# Patient Record
Sex: Male | Born: 1955 | Race: Black or African American | Hispanic: No | Marital: Single | Smoking: Never smoker
Health system: Southern US, Community
[De-identification: ages and names within clinical notes are randomized; demographics above are authoritative.]

## PROBLEM LIST (undated history)

## (undated) DIAGNOSIS — I2510 Atherosclerotic heart disease of native coronary artery without angina pectoris: Secondary | ICD-10-CM

## (undated) DIAGNOSIS — I255 Ischemic cardiomyopathy: Secondary | ICD-10-CM

## (undated) DIAGNOSIS — E785 Hyperlipidemia, unspecified: Secondary | ICD-10-CM

## (undated) DIAGNOSIS — I10 Essential (primary) hypertension: Secondary | ICD-10-CM

## (undated) HISTORY — DX: Atherosclerotic heart disease of native coronary artery without angina pectoris: I25.10

## (undated) HISTORY — DX: Essential (primary) hypertension: I10

## (undated) HISTORY — DX: Hyperlipidemia, unspecified: E78.5

## (undated) HISTORY — DX: Ischemic cardiomyopathy: I25.5

---

## 2013-05-29 ENCOUNTER — Other Ambulatory Visit (HOSPITAL_COMMUNITY): Payer: Self-pay | Admitting: Family Medicine

## 2013-05-29 DIAGNOSIS — R0602 Shortness of breath: Secondary | ICD-10-CM

## 2013-06-09 ENCOUNTER — Ambulatory Visit (HOSPITAL_COMMUNITY)
Admission: RE | Admit: 2013-06-09 | Discharge: 2013-06-09 | Disposition: A | Discharge status: Home / Self Care | Payer: Self-pay | Source: Ambulatory Visit | Attending: Family Medicine | Admitting: Family Medicine

## 2013-06-09 DIAGNOSIS — R0602 Shortness of breath: Secondary | ICD-10-CM | POA: Insufficient documentation

## 2013-06-09 MED ORDER — ALBUTEROL SULFATE (5 MG/ML) 0.5% IN NEBU
2.5000 mg | INHALATION_SOLUTION | Freq: Once | RESPIRATORY_TRACT | Status: AC
  Administered 2013-06-09: 2.5 mg via RESPIRATORY_TRACT

## 2017-03-29 ENCOUNTER — Encounter: Payer: Self-pay | Admitting: Emergency Medicine

## 2017-03-29 ENCOUNTER — Emergency Department
Admission: EM | Admit: 2017-03-29 | Discharge: 2017-03-29 | Disposition: A | Discharge status: Home / Self Care | Payer: Self-pay | Attending: Emergency Medicine | Admitting: Emergency Medicine

## 2017-03-29 DIAGNOSIS — T63441A Toxic effect of venom of bees, accidental (unintentional), initial encounter: Secondary | ICD-10-CM | POA: Insufficient documentation

## 2017-03-29 DIAGNOSIS — Y939 Activity, unspecified: Secondary | ICD-10-CM | POA: Insufficient documentation

## 2017-03-29 DIAGNOSIS — Y999 Unspecified external cause status: Secondary | ICD-10-CM | POA: Insufficient documentation

## 2017-03-29 DIAGNOSIS — Y929 Unspecified place or not applicable: Secondary | ICD-10-CM | POA: Insufficient documentation

## 2017-03-29 MED ORDER — HYDROXYZINE HCL 50 MG PO TABS
50.0000 mg | ORAL_TABLET | Freq: Once | ORAL | Status: AC
  Administered 2017-03-29: 50 mg via ORAL
  Filled 2017-03-29: qty 1

## 2017-03-29 MED ORDER — HYDROXYZINE HCL 50 MG PO TABS: 50.0000 mg | ORAL_TABLET | Freq: Three times a day (TID) | ORAL | 0 refills | Status: DC | PRN

## 2017-03-29 MED ORDER — PREDNISONE 20 MG PO TABS
60.0000 mg | ORAL_TABLET | Freq: Once | ORAL | Status: AC
  Administered 2017-03-29: 60 mg via ORAL
  Filled 2017-03-29: qty 3

## 2017-03-29 MED ORDER — METHYLPREDNISOLONE 4 MG PO TBPK: ORAL_TABLET | ORAL | 0 refills | Status: DC

## 2017-03-29 NOTE — ED Provider Notes (Signed)
[facility] Emergency Department Provider Note   ____________________________________________   First MD Initiated Contact with Patient 03/29/17 0920     (approximate)  I have reviewed the triage vital signs and the nursing notes.   HISTORY  Chief Complaint Bee stings    HPI Todd Reynolds is a 61 y.o. male patient complaining of bilateral hand edema secondary to bee stings yesterday. Patient denies any anaphylactic signs and symptoms. Patient state is not allergic to bee stings.Patient rates his discomfort/pain as a 5/10. No palliative measures for complaint.   History reviewed. No pertinent past medical history.  There are no active problems to display for this patient.   No past surgical history on file.  Prior to Admission medications   Medication Sig Start Date End Date Taking? Authorizing Provider  hydrOXYzine (ATARAX/VISTARIL) 50 MG tablet Take 1 tablet (50 mg total) by mouth 3 (three) times daily as needed for itching. 03/29/17   Joni Reining, PA-C  methylPREDNISolone (MEDROL DOSEPAK) 4 MG TBPK tablet Take Tapered dose as directed start first dose 03-30-2017. 03/29/17   Joni Reining, PA-C    Allergies Patient has no known allergies.  No family history on file.  Social History Social History  Substance Use Topics  . Smoking status: Not on file  . Smokeless tobacco: Not on file  . Alcohol use Not on file    Review of Systems  Constitutional: No fever/chills Eyes: No visual changes. ENT: No sore throat. Cardiovascular: Denies chest pain. Respiratory: Denies shortness of breath. Gastrointestinal: No abdominal pain.  No nausea, no vomiting.  No diarrhea.  No constipation. Genitourinary: Negative for dysuria. Musculoskeletal: Negative for back pain. Skin: Negative for rash.Bilateral hand edema Neurological: Negative for headaches, focal weakness or numbness.   ____________________________________________   PHYSICAL  EXAM:  VITAL SIGNS: ED Triage Vitals [03/29/17 0905]  Enc Vitals Group     BP 133/90     Pulse Rate 71     Resp 18     Temp 98 F (36.7 C)     Temp Source Oral     SpO2 97 %     Weight 180 lb (81.6 kg)     Height 5\' 10"  (1.778 m)     Head Circumference      Peak Flow      Pain Score 5     Pain Loc      Pain Edu?      Excl. in GC?     Constitutional: Alert and oriented. Well appearing and in no acute distress. Eyes: Conjunctivae are normal. PERRL. EOMI. Head: Atraumatic. Nose: No congestion/rhinnorhea. Mouth/Throat: Mucous membranes are moist.  Oropharynx non-erythematous. Neck: No stridor.   Cardiovascular: Normal rate, regular rhythm. Grossly normal heart sounds.  Good peripheral circulation. Respiratory: Normal respiratory effort.  No retractions. Lungs CTAB. Neurologic:  Normal speech and language. No gross focal neurologic deficits are appreciated. No gait instability. Skin:  Skin is warm, dry and intact. No rash noted.Bilateral hand edema Psychiatric: Mood and affect are normal. Speech and behavior are normal.  ____________________________________________   LABS (all labs ordered are listed, but only abnormal results are displayed)  Labs Reviewed - No data to display ____________________________________________  EKG   ____________________________________________  RADIOLOGY  No results found.  ____________________________________________   PROCEDURES  Procedure(s) performed: None  Procedures  Critical Care performed: No  ____________________________________________   INITIAL IMPRESSION / ASSESSMENT AND PLAN / ED COURSE  Pertinent labs & imaging results that were available during  my care of the patient were reviewed by me and considered in my medical decision making (see chart for details).  Localized reaction to bee sting. No anaphylactic. Patient given discharge Instructions. Patient advised [facility]  persists.      ____________________________________________   FINAL CLINICAL IMPRESSION(S) / ED DIAGNOSES  Final diagnoses:  Bee sting, accidental or unintentional, initial encounter      NEW MEDICATIONS STARTED DURING THIS VISIT:  New Prescriptions   HYDROXYZINE (ATARAX/VISTARIL) 50 MG TABLET    Take 1 tablet (50 mg total) by mouth 3 (three) times daily as needed for itching.   METHYLPREDNISOLONE (MEDROL DOSEPAK) 4 MG TBPK TABLET    Take Tapered dose as directed start first dose 03-30-2017.     Note:  This document was prepared using Dragon voice recognition software and may include unintentional dictation errors.    Joni ReiningSmith, Damario Gillie K, PA-C 03/29/17 0940    Minna AntisPaduchowski, Kevin, MD 03/29/17 [phone]

## 2017-03-29 NOTE — ED Notes (Signed)
See triage note  States he was doing some work yesterday and had several bee stings to hands  Both hands swollen   No resp distress

## 2017-03-29 NOTE — ED Triage Notes (Signed)
Pt reports had multiple bee stings to hands yesterday. Pt denies any difficulty breathing. Respirations even and nonlabored in triage, speaking in complete sentences without difficulty. Pt presents with swelling to hands bilaterally.

## 2018-08-28 ENCOUNTER — Other Ambulatory Visit: Payer: Self-pay

## 2018-08-28 ENCOUNTER — Inpatient Hospital Stay
Admission: EM | Admit: 2018-08-28 | Discharge: 2018-08-30 | DRG: 247 | Disposition: A | Discharge status: Home / Self Care | Payer: Medicaid Other | Attending: Internal Medicine | Admitting: Internal Medicine

## 2018-08-28 ENCOUNTER — Emergency Department: Payer: Medicaid Other

## 2018-08-28 ENCOUNTER — Encounter
Admission: EM | Disposition: A | Discharge status: Home / Self Care | Payer: Self-pay | Source: Home / Self Care | Attending: Internal Medicine

## 2018-08-28 DIAGNOSIS — I214 Non-ST elevation (NSTEMI) myocardial infarction: Principal | ICD-10-CM | POA: Diagnosis present

## 2018-08-28 DIAGNOSIS — E876 Hypokalemia: Secondary | ICD-10-CM | POA: Diagnosis present

## 2018-08-28 DIAGNOSIS — Z8249 Family history of ischemic heart disease and other diseases of the circulatory system: Secondary | ICD-10-CM | POA: Diagnosis not present

## 2018-08-28 DIAGNOSIS — I2510 Atherosclerotic heart disease of native coronary artery without angina pectoris: Secondary | ICD-10-CM | POA: Diagnosis present

## 2018-08-28 DIAGNOSIS — Z833 Family history of diabetes mellitus: Secondary | ICD-10-CM

## 2018-08-28 DIAGNOSIS — Z23 Encounter for immunization: Secondary | ICD-10-CM | POA: Diagnosis not present

## 2018-08-28 DIAGNOSIS — E785 Hyperlipidemia, unspecified: Secondary | ICD-10-CM | POA: Diagnosis present

## 2018-08-28 DIAGNOSIS — R451 Restlessness and agitation: Secondary | ICD-10-CM | POA: Diagnosis not present

## 2018-08-28 DIAGNOSIS — I10 Essential (primary) hypertension: Secondary | ICD-10-CM | POA: Diagnosis present

## 2018-08-28 DIAGNOSIS — R079 Chest pain, unspecified: Secondary | ICD-10-CM | POA: Diagnosis present

## 2018-08-28 DIAGNOSIS — Z79899 Other long term (current) drug therapy: Secondary | ICD-10-CM

## 2018-08-28 DIAGNOSIS — Z7902 Long term (current) use of antithrombotics/antiplatelets: Secondary | ICD-10-CM | POA: Diagnosis not present

## 2018-08-28 DIAGNOSIS — F1523 Other stimulant dependence with withdrawal: Secondary | ICD-10-CM | POA: Diagnosis present

## 2018-08-28 DIAGNOSIS — Z794 Long term (current) use of insulin: Secondary | ICD-10-CM

## 2018-08-28 DIAGNOSIS — Z7982 Long term (current) use of aspirin: Secondary | ICD-10-CM | POA: Diagnosis not present

## 2018-08-28 HISTORY — PX: CORONARY THROMBECTOMY: CATH118304

## 2018-08-28 HISTORY — PX: LEFT HEART CATH AND CORONARY ANGIOGRAPHY: CATH118249

## 2018-08-28 HISTORY — PX: CORONARY STENT INTERVENTION: CATH118234

## 2018-08-28 LAB — URINE DRUG SCREEN, QUALITATIVE (ARMC ONLY)
AMPHETAMINES, UR SCREEN: POSITIVE — AB
Barbiturates, Ur Screen: NOT DETECTED
Benzodiazepine, Ur Scrn: POSITIVE — AB
Cannabinoid 50 Ng, Ur ~~LOC~~: NOT DETECTED
Cocaine Metabolite,Ur ~~LOC~~: NOT DETECTED
MDMA (Ecstasy)Ur Screen: NOT DETECTED
Methadone Scn, Ur: NOT DETECTED
Opiate, Ur Screen: NOT DETECTED
Phencyclidine (PCP) Ur S: NOT DETECTED
Tricyclic, Ur Screen: NOT DETECTED

## 2018-08-28 LAB — CBC
HCT: 47.6 % (ref 39.0–52.0)
HEMOGLOBIN: 15 g/dL (ref 13.0–17.0)
MCH: 27.8 pg (ref 26.0–34.0)
MCHC: 31.5 g/dL (ref 30.0–36.0)
MCV: 88.1 fL (ref 80.0–100.0)
Platelets: 213 10*3/uL (ref 150–400)
RBC: 5.4 MIL/uL (ref 4.22–5.81)
RDW: 13.7 % (ref 11.5–15.5)
WBC: 7.1 10*3/uL (ref 4.0–10.5)
nRBC: 0 % (ref 0.0–0.2)

## 2018-08-28 LAB — BASIC METABOLIC PANEL
Anion gap: 8 (ref 5–15)
BUN: 7 mg/dL (ref 6–20)
CO2: 27 mmol/L (ref 22–32)
CREATININE: 0.98 mg/dL (ref 0.61–1.24)
Calcium: 9 mg/dL (ref 8.9–10.3)
Chloride: 105 mmol/L (ref 98–111)
GFR calc non Af Amer: >60 mL/min (ref >60)
GLUCOSE: 119 mg/dL — AB (ref 70–99)
POTASSIUM: 3.2 mmol/L — AB (ref 3.5–5.1)
SODIUM: 140 mmol/L (ref 135–145)

## 2018-08-28 LAB — TROPONIN I
TROPONIN I: 6.41 ng/mL — AB (ref <0.03)
Troponin I: 5.24 ng/mL (ref <0.03)

## 2018-08-28 LAB — APTT: aPTT: 29 seconds (ref 24–36)

## 2018-08-28 LAB — POCT ACTIVATED CLOTTING TIME
Activated Clotting Time: 268 seconds
Activated Clotting Time: 279 seconds

## 2018-08-28 LAB — ETHANOL: Alcohol, Ethyl (B): 12 mg/dL — ABNORMAL HIGH (ref <10)

## 2018-08-28 LAB — MAGNESIUM: MAGNESIUM: 1.9 mg/dL (ref 1.7–2.4)

## 2018-08-28 LAB — MRSA PCR SCREENING: MRSA by PCR: NEGATIVE

## 2018-08-28 LAB — PROTIME-INR
INR: 0.99
Prothrombin Time: 13 seconds (ref 11.4–15.2)

## 2018-08-28 SURGERY — LEFT HEART CATH AND CORONARY ANGIOGRAPHY
Anesthesia: Moderate Sedation

## 2018-08-28 MED ORDER — MIDAZOLAM HCL 2 MG/2ML IJ SOLN
INTRAMUSCULAR | Status: AC
  Filled 2018-08-28: qty 2

## 2018-08-28 MED ORDER — HEPARIN (PORCINE) 25000 UT/250ML-% IV SOLN
1000.0000 [IU]/h | INTRAVENOUS | Status: DC
  Administered 2018-08-28: 1000 [IU]/h via INTRAVENOUS
  Filled 2018-08-28: qty 250

## 2018-08-28 MED ORDER — TIROFIBAN HCL IN NACL 5-0.9 MG/100ML-% IV SOLN
0.1500 ug/kg/min | INTRAVENOUS | Status: DC
  Filled 2018-08-28: qty 100

## 2018-08-28 MED ORDER — ASPIRIN EC 81 MG PO TBEC
81.0000 mg | DELAYED_RELEASE_TABLET | Freq: Every day | ORAL | Status: DC
  Administered 2018-08-29 – 2018-08-30 (×2): 81 mg via ORAL
  Filled 2018-08-28 (×2): qty 1

## 2018-08-28 MED ORDER — HEPARIN BOLUS VIA INFUSION
4000.0000 [IU] | Freq: Once | INTRAVENOUS | Status: AC
  Administered 2018-08-28: 4000 [IU] via INTRAVENOUS
  Filled 2018-08-28: qty 4000

## 2018-08-28 MED ORDER — DEXMEDETOMIDINE HCL IN NACL 400 MCG/100ML IV SOLN
0.4000 ug/kg/h | INTRAVENOUS | Status: DC
  Administered 2018-08-28: 0.8 ug/kg/h via INTRAVENOUS
  Administered 2018-08-29: 0.6 ug/kg/h via INTRAVENOUS
  Filled 2018-08-28 (×2): qty 100

## 2018-08-28 MED ORDER — HEPARIN SODIUM (PORCINE) 1000 UNIT/ML IJ SOLN
INTRAMUSCULAR | Status: DC | PRN
  Administered 2018-08-28: 4000 [IU] via INTRAVENOUS
  Administered 2018-08-28: 2000 [IU] via INTRAVENOUS
  Administered 2018-08-28: 4000 [IU] via INTRAVENOUS

## 2018-08-28 MED ORDER — NITROGLYCERIN 1 MG/10 ML FOR IR/CATH LAB
INTRA_ARTERIAL | Status: DC | PRN
  Administered 2018-08-28: 200 ug via INTRACORONARY

## 2018-08-28 MED ORDER — ALPRAZOLAM 0.5 MG PO TABS
ORAL_TABLET | ORAL | Status: AC
  Administered 2018-08-28: 20:00:00
  Filled 2018-08-28: qty 1

## 2018-08-28 MED ORDER — LABETALOL HCL 5 MG/ML IV SOLN
10.0000 mg | INTRAVENOUS | Status: DC | PRN
  Filled 2018-08-28: qty 4

## 2018-08-28 MED ORDER — SODIUM CHLORIDE 0.9 % WEIGHT BASED INFUSION
3.0000 mL/kg/h | INTRAVENOUS | Status: DC
  Administered 2018-08-28: 3 mL/kg/h via INTRAVENOUS

## 2018-08-28 MED ORDER — HYDRALAZINE HCL 20 MG/ML IJ SOLN
INTRAMUSCULAR | Status: AC
  Filled 2018-08-28: qty 1

## 2018-08-28 MED ORDER — ATORVASTATIN CALCIUM 20 MG PO TABS
40.0000 mg | ORAL_TABLET | Freq: Every day | ORAL | Status: DC
  Administered 2018-08-29: 40 mg via ORAL
  Filled 2018-08-28: qty 2

## 2018-08-28 MED ORDER — TICAGRELOR 90 MG PO TABS
90.0000 mg | ORAL_TABLET | Freq: Two times a day (BID) | ORAL | Status: DC
  Administered 2018-08-29 – 2018-08-30 (×3): 90 mg via ORAL
  Filled 2018-08-28 (×4): qty 1

## 2018-08-28 MED ORDER — ENOXAPARIN SODIUM 40 MG/0.4ML ~~LOC~~ SOLN
40.0000 mg | SUBCUTANEOUS | Status: DC
  Administered 2018-08-29: 40 mg via SUBCUTANEOUS
  Filled 2018-08-28: qty 0.4

## 2018-08-28 MED ORDER — FENTANYL CITRATE (PF) 100 MCG/2ML IJ SOLN
INTRAMUSCULAR | Status: AC
  Filled 2018-08-28: qty 2

## 2018-08-28 MED ORDER — SODIUM CHLORIDE 0.9% FLUSH
3.0000 mL | Freq: Two times a day (BID) | INTRAVENOUS | Status: DC
  Administered 2018-08-28 – 2018-08-29 (×3): 3 mL via INTRAVENOUS

## 2018-08-28 MED ORDER — MIDAZOLAM HCL 2 MG/2ML IJ SOLN
INTRAMUSCULAR | Status: DC | PRN
  Administered 2018-08-28 (×2): 1 mg via INTRAVENOUS

## 2018-08-28 MED ORDER — SODIUM CHLORIDE 0.9% FLUSH: 3.0000 mL | INTRAVENOUS | Status: DC | PRN

## 2018-08-28 MED ORDER — POTASSIUM CHLORIDE CRYS ER 20 MEQ PO TBCR: 40.0000 meq | EXTENDED_RELEASE_TABLET | Freq: Once | ORAL | Status: DC

## 2018-08-28 MED ORDER — POTASSIUM CHLORIDE 10 MEQ/100ML IV SOLN
10.0000 meq | INTRAVENOUS | Status: DC
  Administered 2018-08-28 – 2018-08-29 (×2): 10 meq via INTRAVENOUS
  Filled 2018-08-28 (×4): qty 100

## 2018-08-28 MED ORDER — METOPROLOL TARTRATE 25 MG PO TABS
12.5000 mg | ORAL_TABLET | Freq: Two times a day (BID) | ORAL | Status: DC
  Administered 2018-08-29 – 2018-08-30 (×3): 12.5 mg via ORAL
  Filled 2018-08-28 (×3): qty 1

## 2018-08-28 MED ORDER — SODIUM CHLORIDE 0.9 % WEIGHT BASED INFUSION: 1.0000 mL/kg/h | INTRAVENOUS | Status: DC

## 2018-08-28 MED ORDER — SODIUM CHLORIDE 0.9 % IV SOLN: 250.0000 mL | INTRAVENOUS | Status: DC | PRN

## 2018-08-28 MED ORDER — HEPARIN SODIUM (PORCINE) 1000 UNIT/ML IJ SOLN
INTRAMUSCULAR | Status: AC
  Filled 2018-08-28: qty 1

## 2018-08-28 MED ORDER — LORAZEPAM 2 MG/ML IJ SOLN
0.5000 mg | INTRAMUSCULAR | Status: DC | PRN
  Administered 2018-08-28: 0.5 mg via INTRAVENOUS
  Filled 2018-08-28: qty 1

## 2018-08-28 MED ORDER — TIROFIBAN (AGGRASTAT) BOLUS VIA INFUSION
INTRAVENOUS | Status: DC | PRN
  Administered 2018-08-28: 2040 ug via INTRAVENOUS

## 2018-08-28 MED ORDER — NITROGLYCERIN 0.4 MG SL SUBL
0.4000 mg | SUBLINGUAL_TABLET | SUBLINGUAL | Status: DC | PRN
  Administered 2018-08-28: 0.4 mg via SUBLINGUAL
  Filled 2018-08-28: qty 1

## 2018-08-28 MED ORDER — ZOLPIDEM TARTRATE 5 MG PO TABS: 5.0000 mg | ORAL_TABLET | Freq: Every evening | ORAL | Status: DC | PRN

## 2018-08-28 MED ORDER — MIDAZOLAM HCL 2 MG/2ML IJ SOLN
1.0000 mg | Freq: Once | INTRAMUSCULAR | Status: DC
  Administered 2018-08-28: 1 mg via INTRAVENOUS

## 2018-08-28 MED ORDER — SODIUM CHLORIDE 0.9% FLUSH: 3.0000 mL | Freq: Two times a day (BID) | INTRAVENOUS | Status: DC

## 2018-08-28 MED ORDER — SODIUM CHLORIDE 0.9 % IV SOLN
250.0000 mL | INTRAVENOUS | Status: DC | PRN
  Administered 2018-08-28: 250 mL via INTRAVENOUS

## 2018-08-28 MED ORDER — TICAGRELOR 90 MG PO TABS
ORAL_TABLET | ORAL | Status: AC
  Filled 2018-08-28: qty 2

## 2018-08-28 MED ORDER — HYDRALAZINE HCL 20 MG/ML IJ SOLN
10.0000 mg | Freq: Four times a day (QID) | INTRAMUSCULAR | Status: DC | PRN
  Administered 2018-08-28: 10 mg via INTRAVENOUS

## 2018-08-28 MED ORDER — NITROGLYCERIN 5 MG/ML IV SOLN
INTRAVENOUS | Status: AC
  Filled 2018-08-28: qty 10

## 2018-08-28 MED ORDER — TICAGRELOR 90 MG PO TABS
ORAL_TABLET | ORAL | Status: DC | PRN
  Administered 2018-08-28: 180 mg via ORAL

## 2018-08-28 MED ORDER — LISINOPRIL 5 MG PO TABS
5.0000 mg | ORAL_TABLET | Freq: Every day | ORAL | Status: DC
  Administered 2018-08-29 – 2018-08-30 (×2): 5 mg via ORAL
  Filled 2018-08-28 (×2): qty 0.5
  Filled 2018-08-28 (×2): qty 1

## 2018-08-28 MED ORDER — MAGNESIUM SULFATE IN D5W 1-5 GM/100ML-% IV SOLN
1.0000 g | Freq: Once | INTRAVENOUS | Status: AC
  Administered 2018-08-28: 1 g via INTRAVENOUS
  Filled 2018-08-28: qty 100

## 2018-08-28 MED ORDER — FENTANYL CITRATE (PF) 100 MCG/2ML IJ SOLN
INTRAMUSCULAR | Status: DC | PRN
  Administered 2018-08-28 (×2): 25 ug via INTRAVENOUS

## 2018-08-28 MED ORDER — TIROFIBAN HCL IV 12.5 MG/250 ML
INTRAVENOUS | Status: AC | PRN
  Administered 2018-08-28: 0.15 ug/kg/min via INTRAVENOUS

## 2018-08-28 MED ORDER — ASPIRIN 81 MG PO CHEW: 81.0000 mg | CHEWABLE_TABLET | ORAL | Status: DC

## 2018-08-28 MED ORDER — VERAPAMIL HCL 2.5 MG/ML IV SOLN
INTRAVENOUS | Status: AC
  Filled 2018-08-28: qty 2

## 2018-08-28 MED ORDER — ASPIRIN 81 MG PO CHEW
324.0000 mg | CHEWABLE_TABLET | Freq: Once | ORAL | Status: AC
  Administered 2018-08-28: 324 mg via ORAL
  Filled 2018-08-28: qty 4

## 2018-08-28 MED ORDER — VERAPAMIL HCL 2.5 MG/ML IV SOLN
INTRAVENOUS | Status: DC | PRN
  Administered 2018-08-28: 2.5 mg via INTRA_ARTERIAL

## 2018-08-28 MED ORDER — ACETAMINOPHEN 325 MG PO TABS: 650.0000 mg | ORAL_TABLET | ORAL | Status: DC | PRN

## 2018-08-28 MED ORDER — HYDRALAZINE HCL 20 MG/ML IJ SOLN: 5.0000 mg | INTRAMUSCULAR | Status: DC | PRN

## 2018-08-28 MED ORDER — ASPIRIN 81 MG PO CHEW: 81.0000 mg | CHEWABLE_TABLET | Freq: Every day | ORAL | Status: DC

## 2018-08-28 MED ORDER — ONDANSETRON HCL 4 MG/2ML IJ SOLN: 4.0000 mg | Freq: Four times a day (QID) | INTRAMUSCULAR | Status: DC | PRN

## 2018-08-28 MED ORDER — ALPRAZOLAM 0.25 MG PO TABS: 0.2500 mg | ORAL_TABLET | Freq: Two times a day (BID) | ORAL | Status: DC | PRN

## 2018-08-28 SURGICAL SUPPLY — 17 items
BALLN TREK RX 2.25X15 (BALLOONS) ×3
BALLN ~~LOC~~ TREK RX 3.25X12 (BALLOONS) ×3
BALLOON TREK RX 2.25X15 (BALLOONS) ×1 IMPLANT
BALLOON ~~LOC~~ TREK RX 3.25X12 (BALLOONS) ×1 IMPLANT
CATH EXTRAC PRONTO 5.5F 138CM (CATHETERS) ×3 IMPLANT
CATH INFINITI 5 FR JL3.5 (CATHETERS) ×3 IMPLANT
CATH INFINITI 5FR ANG PIGTAIL (CATHETERS) ×3 IMPLANT
CATH INFINITI JR4 5F (CATHETERS) ×3 IMPLANT
CATH LAUNCHER 6FR EBU3.5 (CATHETERS) ×3 IMPLANT
DEVICE INFLAT 30 PLUS (MISCELLANEOUS) ×3 IMPLANT
DEVICE RAD TR BAND REGULAR (VASCULAR PRODUCTS) ×3 IMPLANT
GLIDESHEATH SLEND SS 6F .021 (SHEATH) ×3 IMPLANT
KIT MANI 3VAL PERCEP (MISCELLANEOUS) ×3 IMPLANT
PACK CARDIAC CATH (CUSTOM PROCEDURE TRAY) ×3 IMPLANT
STENT SIERRA 3.00 X 15 MM (Permanent Stent) ×3 IMPLANT
WIRE ROSEN-J .035X260CM (WIRE) ×3 IMPLANT
WIRE RUNTHROUGH .014X180CM (WIRE) ×3 IMPLANT

## 2018-08-28 NOTE — H&P (View-Only) (Signed)
Cardiology Consultation:   Patient ID: Todd Reynolds MRN: [id number]; DOB: 07/07/1956  Admit date: 08/28/2018 Date of Consult: 08/28/2018  Primary Care Provider: System, Pcp Not In Primary Cardiologist: [facility], Dr. End Primary Electrophysiologist:  None    Patient Profile:   Todd Reynolds is a 62 y.o. male with no past cardiac history who is being seen today for the evaluation of chest pain and elevated troponin / NSTEMI  at the request of Dr. Paduchowski.  History of Present Illness:   [name] is a 62 male with no known cardiac history.  Yesterday, 08/27/2018, he was reportedly doing yard work around 12 PM/1 PM when he felt central, substernal, nonpleuritic, nonradiating chest pressure/pain rated 7-8/10.  Associated symptoms included shortness of breath.  No associated diaphoresis, nausea, abdominal pain, headache, or symptoms of presyncope.  This pain reportedly lasted until he went to sleep that night.  When he awoke in the morning today, 08/28/2018, he reported 3/10 chest pain with associated tingling and decided to report to [facility] ED.  Of note, the patient denied any history of smoking, alcohol, or illegal drug use. He stated he took Dayquil yesterday and prior to doing the yard work. He denied any recent melena or hematuria and not recent bleeding issues. No h/o leg swelling / lower extremity edema. He last ate yesterday evening.   In the ED, he was found to be hypokalemic with stable renal function and elevated troponin. EKG showed t wave inversion and troponin consistent with NSTEMI. He was also noted to be hypertensive and bradycardic. Vitals: BP 147/89, HR 71, RR 18, T98.7F, SpO2 98% Troponin 5.24 Labs: Na 140, K 3.2, glucose 119, Cr 0.98, BUN 7, Ca 9.0, RBC 5.40, Hgb 15.0, plts 213  EKG: SR, 77bpm, Inferolateral t wave inversion / ST changes in inferolateral leads CXR: No edema or consolidation, mild cardiac prominence Meds: Started on nitro and heparin drip. Given ASA 324mg.     At the time of his interview in the emergency department today, he reported minimal chest pain and no further SOB. He denied any furth sx, reporting relief with nitroglycerin.   Troponin 5.24   History reviewed. No pertinent past medical history.  History reviewed. No pertinent surgical history.   Home Medications:  Prior to Admission medications   Medication Sig Start Date End Date Taking? Authorizing Provider  hydrOXYzine (ATARAX/VISTARIL) 50 MG tablet Take 1 tablet (50 mg total) by mouth 3 (three) times daily as needed for itching. Patient not taking: Reported on 08/28/2018 03/29/17   Smith, Ronald K, PA-C  methylPREDNISolone (MEDROL DOSEPAK) 4 MG TBPK tablet Take Tapered dose as directed start first dose 03-30-2017. Patient not taking: Reported on 08/28/2018 03/29/17   Smith, Ronald K, PA-C    Inpatient Medications: Scheduled Meds:  Continuous Infusions: . heparin 1,000 Units/hr (08/28/18 1538)   PRN Meds: nitroGLYCERIN  Allergies:   No Known Allergies  Social History:   Social History   Socioeconomic History  . Marital status: Single    Spouse name: Not on file  . Number of children: Not on file  . Years of education: Not on file  . Highest education level: Not on file  Occupational History  . Not on file  Social Needs  . Financial resource strain: Not on file  . Food insecurity:    Worry: Not on file    Inability: Not on file  . Transportation needs:    Medical: Not on file    Non-medical: Not on file    Tobacco Use  . Smoking status: Never Smoker  . Smokeless tobacco: Never Used  Substance and Sexual Activity  . Alcohol use: Never    Frequency: Never  . Drug use: Never  . Sexual activity: Not on file  Lifestyle  . Physical activity:    Days per week: Not on file    Minutes per session: Not on file  . Stress: Not on file  Relationships  . Social connections:    Talks on phone: Not on file    Gets together: Not on file    Attends religious service:  Not on file    Active member of club or organization: Not on file    Attends meetings of clubs or organizations: Not on file    Relationship status: Not on file  . Intimate partner violence:    Fear of current or ex partner: Not on file    Emotionally abused: Not on file    Physically abused: Not on file    Forced sexual activity: Not on file  Other Topics Concern  . Not on file  Social History Narrative  . Not on file    Family History:   No family history on file.   ROS:  Please see the history of present illness.  Review of Systems  Constitutional: Negative for chills and fever.  Respiratory: Positive for shortness of breath. Negative for hemoptysis and wheezing.   Cardiovascular: Positive for chest pain. Negative for palpitations and leg swelling.       CP improved with nitro  Gastrointestinal: Negative for abdominal pain, blood in stool, melena, nausea and vomiting.  Genitourinary: Negative for flank pain and hematuria.  Neurological: Positive for tingling. Negative for dizziness, tremors, sensory change, speech change, focal weakness, loss of consciousness and headaches.       Tingling CP  Psychiatric/Behavioral: Negative for substance abuse.    All other ROS reviewed and negative.     Physical Exam/Data:   Vitals:   08/28/18 1332 08/28/18 1458 08/28/18 1523  BP: (!) 147/89 (!) 167/99   Pulse: 71 (!) 54 62  Resp: 18 19 15  Temp: 98.7 F (37.1 C)    TempSrc: Oral    SpO2: 98% 100% 100%  Weight: 81.6 kg    Height: 5' 10" (1.778 m)     No intake or output data in the 24 hours ending 08/28/18 1552 Filed Weights   08/28/18 1332  Weight: 81.6 kg   Body mass index is 25.83 kg/m.  General:  Well nourished, well developed, in no acute distress HEENT: normal Lymph: no adenopathy Neck: no JVD Endocrine:  No thryomegaly Vascular: No carotid bruits; FA pulses 2+ bilaterally without bruits  Cardiac:  normal S1, S2; sinus bradycardia but regular rhythm; no murmur    Lungs:  clear to auscultation bilaterally, no wheezing, rhonchi or rales  Abd: soft, nontender, no hepatomegaly  Ext: no edema Musculoskeletal:  No deformities, BUE and BLE strength normal and equal Skin: warm and dry  Neuro:  CNs 2-12 intact, no focal abnormalities noted Psych:  Normal affect   EKG:  The EKG was personally reviewed and demonstrates: SR, Inferolateral ST/T changes, TWI Telemetry:  Telemetry was personally reviewed and demonstrates:  Sinus bradycardia with PVCs and rates 50-58bpm  Relevant CV Studies: None pending cath  Laboratory Data:  Chemistry Recent Labs  Lab 08/28/18 1334  NA 140  K 3.2*  CL 105  CO2 27  GLUCOSE 119*  BUN 7  CREATININE 0.98  CALCIUM   9.0  GFRNONAA >60  GFRAA >60  ANIONGAP 8    No results for input(s): PROT, ALBUMIN, AST, ALT, ALKPHOS, BILITOT in the last 168 hours. Hematology Recent Labs  Lab 08/28/18 1334  WBC 7.1  RBC 5.40  HGB 15.0  HCT 47.6  MCV 88.1  MCH 27.8  MCHC 31.5  RDW 13.7  PLT 213   Cardiac Enzymes Recent Labs  Lab 08/28/18 1334  TROPONINI 5.24*   No results for input(s): TROPIPOC in the last 168 hours.  BNPNo results for input(s): BNP, PROBNP in the last 168 hours.  DDimer No results for input(s): DDIMER in the last 168 hours.  Radiology/Studies:  Dg Chest 2 View  Result Date: 08/28/2018 CLINICAL DATA:  Chest pain EXAM: CHEST - 2 VIEW COMPARISON:  None. FINDINGS: There is no edema or consolidation. Heart is slightly enlarged with pulmonary vascularity normal. No adenopathy. No bone lesions. No pneumothorax. IMPRESSION: Mild cardiac prominence.  No edema or consolidation. Electronically Signed   By: William  Woodruff III M.D.   On: 08/28/2018 14:12    Assessment and Plan:   NSTEMI - Troponin 5.24. EKG as above: SR with inferolateral changes - Labs showing stable renal function and Hgb 15.0 - Consented for immediate LHC.  - Further recommendations regarding medical management and repeat EKG  following cardiac catheterization  Hypokalemia - Repletion following catheterization and repeat labs  - Will check Mg following cardiac catheterization  - Daily BMET  Elevated BP - Continue to monitor vitals following cardiac catheterization    For questions or updates, please contact [facility] Please consult [email or web address] for contact info under     Signed, Keionte Swicegood D Saburo Luger, PA-C  08/28/2018 3:52 PM   

## 2018-08-28 NOTE — ED Notes (Signed)
Pt being transferred to special recovery room 5

## 2018-08-28 NOTE — Consult Note (Signed)
ANTICOAGULATION CONSULT NOTE   Pharmacy Consult for Aggrastat Indication: post-PCI  Patient Measurements: Height: 5\' 10"  (177.8 cm) Weight: 180 lb (81.6 kg) IBW/kg (Calculated) : 73  Vital Signs: Temp: 98.3 F (36.8 C) (12/11 1604) Temp Source: Oral (12/11 1604) BP: 183/99 (12/11 1604) Pulse Rate: 69 (12/11 1604)  Labs: Recent Labs    08/28/18 1334 08/28/18 1530  HGB 15.0  --   HCT 47.6  --   PLT 213  --   APTT  --  29  LABPROT  --  13.0  INR  --  0.99  CREATININE 0.98  --   TROPONINI 5.24*  --     Estimated Creatinine Clearance: 82.8 mL/min (by C-G formula based on SCr of 0.98 mg/dL).  Assessment/Plan: 62 y/o man with no significant PMH, presenting to ED with chest pain. He underwent urgent cardiac catheterization and PCI. His CrCl is >8860mL.min, therefore the appropriate dose of Aggrastat is 0.15 mcg/kg/min. This drug is set to expire in 24 hours, as requested by cardiology   Lowella Bandyodney D Deyra Perdomo, PharmD 08/28/2018,6:28 PM

## 2018-08-28 NOTE — ED Notes (Signed)
Dr. End at bedside. 

## 2018-08-28 NOTE — ED Provider Notes (Signed)
[facility] Emergency Department Provider Note  Time seen: 3:53 PM  I have reviewed the triage vital signs and the nursing notes.   HISTORY  Chief Complaint Chest Pain    HPI Todd Reynolds is a 62 y.o. male with no past medical history although he does not see a physician, presents to the emergency department for chest pain.  According to the patient since 87 PM yesterday he developed chest pain while doing yard work.  Patient states he continues to have chest pain today 7/10 dull aching pain in the center the left side of his chest.  States he felt nauseated and sweaty earlier today as well but denies any shortness of breath at any point.  Denies any leg pain or swelling.  Denies any alcohol or drug use.  No history of cardiac disease in the past.   History reviewed. No pertinent past medical history.  There are no active problems to display for this patient.   History reviewed. No pertinent surgical history.  Prior to Admission medications   Medication Sig Start Date End Date Taking? Authorizing Provider  hydrOXYzine (ATARAX/VISTARIL) 50 MG tablet Take 1 tablet (50 mg total) by mouth 3 (three) times daily as needed for itching. Patient not taking: Reported on 08/28/2018 03/29/17   Joni Reining, PA-C  methylPREDNISolone (MEDROL DOSEPAK) 4 MG TBPK tablet Take Tapered dose as directed start first dose 03-30-2017. Patient not taking: Reported on 08/28/2018 03/29/17   Joni Reining, PA-C    No Known Allergies  No family history on file.  Social History Social History   Tobacco Use  . Smoking status: Never Smoker  . Smokeless tobacco: Never Used  Substance Use Topics  . Alcohol use: Never    Frequency: Never  . Drug use: Never    Review of Systems Constitutional: Negative for fever. Cardiovascular: Positive for chest pain since 12 PM yesterday Respiratory: Negative for shortness of breath. Gastrointestinal: Negative for abdominal pain.  Positive  for nausea. Genitourinary: Negative for urinary compaints Musculoskeletal: Negative for musculoskeletal complaints Skin: Negative for skin complaints  Neurological: Negative for headache All other ROS negative  ____________________________________________   PHYSICAL EXAM:  VITAL SIGNS: ED Triage Vitals [08/28/18 1332]  Enc Vitals Group     BP (!) 147/89     Pulse Rate 71     Resp 18     Temp 98.7 F (37.1 C)     Temp Source Oral     SpO2 98 %     Weight 180 lb (81.6 kg)     Height 5\' 10"  (1.778 m)     Head Circumference      Peak Flow      Pain Score 5     Pain Loc      Pain Edu?      Excl. in GC?     Constitutional: Alert and oriented. Well appearing and in no distress. Eyes: Normal exam ENT   Head: Normocephalic and atraumatic.   Mouth/Throat: Mucous membranes are moist. Cardiovascular: Normal rate, regular rhythm.  Respiratory: Normal respiratory effort without tachypnea nor retractions. Breath sounds are clear Gastrointestinal: Soft and nontender. No distention.   Musculoskeletal: Nontender with normal range of motion in all extremities. No lower extremity tenderness Neurologic:  Normal speech and language. No gross focal neurologic deficits  Skin:  Skin is warm, dry and intact.  Psychiatric: Mood and affect are normal.   ____________________________________________    EKG  EKG viewed and interpreted by myself  shows a normal sinus rhythm at 77 bpm with a narrow QRS, normal axis, normal intervals, patient has inferolateral T wave inversions but no ST elevation.  ____________________________________________    RADIOLOGY  Chest x-ray is negative  ____________________________________________   INITIAL IMPRESSION / ASSESSMENT AND PLAN / ED COURSE  Pertinent labs & imaging results that were available during my care of the patient were reviewed by me and considered in my medical decision making (see chart for details).  Patient presents to the  emergency department for chest pain since yesterday.  Now with nausea and diaphoresis today.  No shortness of breath at any point.  Differential would include ACS, chest wall pain, pneumonia, pneumothorax.  I called cardiology given the patient's inferolateral T wave inversions and troponin has resulted at 5.2.  I have ordered heparin, aspirin and nitroglycerin for the patient.  Dr. Okey DupreEnd of cardiology has been down to see the patient they will be taken to the cardiac catheterization lab today.  There is no ST elevation but given the patient's T wave inversions and elevated troponin they will take for urgent cardiac catheterization.  CRITICAL CARE Performed by: Minna AntisKevin Cleaven Demario   Total critical care time: 30 minutes  Critical care time was exclusive of separately billable procedures and treating other patients.  Critical care was necessary to treat or prevent imminent or life-threatening deterioration.  Critical care was time spent personally by me on the following activities: development of treatment plan with patient and/or surrogate as well as nursing, discussions with consultants, evaluation of patient's response to treatment, examination of patient, obtaining history from patient or surrogate, ordering and performing treatments and interventions, ordering and review of laboratory studies, ordering and review of radiographic studies, pulse oximetry and re-evaluation of patient's condition.   ____________________________________________   FINAL CLINICAL IMPRESSION(S) / ED DIAGNOSES  NSTEMI    Minna AntisPaduchowski, Siria Calandro, MD 08/28/18 1556

## 2018-08-28 NOTE — Progress Notes (Signed)
At 1845 given Hydrazaline 10 mg iv for elevated BP. Pt initially reporting chest [pressure of 2/10 but then reported "too much pressure in right hand. You gotta take the pressure out!" Patient severely agitated and complaining of right hand tightness from his tr band.  Color dark but radial pulses intact good waveform on pulse ox. Removed 1 ml air but immediately noted small hematoma proximal to tr band. Immediate pressure applied to radial artery. Reassured pt. Ice applied to neck. Thrashing legs in bed. Dr End notified. PO xantax given but pt said the medication is messing with his head. Dr End to assess pt. Total time of pressure applied to right radial proximal to TR band 20 minutes.

## 2018-08-28 NOTE — Progress Notes (Addendum)
eLink Physician-Brief Progress Note Patient Name: Todd Reynolds DOB: May 16, 1956 MRN: [id number]   Date of Service  08/28/2018  HPI/Events of Note  Pt is s/p PCI and was agitated during the procedure for which he was given versed and fentanyl.  Post procedure, he has shaking of his legs that he can't control.  He is awake and alert.  Utox (+) for benzos and amphetamine.   eICU Interventions  Trial of precedex gtt.     Intervention Category Major Interventions: Delirium, psychosis, severe agitation - evaluation and management  Larinda ButteryVanessa Chasen Mendell 08/28/2018, 8:19 PM    11:02 PM Follow up with bedside video reveals resolution of this involuntary motor activity of his lower extremities.  Pt is awake and is able to move all extremities equally.  He is still trying to get out of bed intermittently.  Sitter at the bedside.   K repleted via IV.

## 2018-08-28 NOTE — Interval H&P Note (Signed)
History and Physical Interval Note:  08/28/2018 4:31 PM  Todd AlstromMaurice Spells  has presented today for cardiac catheterization, with the diagnosis of NSTEMI  The various methods of treatment have been discussed with the patient and family. After consideration of risks, benefits and other options for treatment, the patient has consented to  Procedure(s): LEFT HEART CATH AND CORONARY ANGIOGRAPHY (N/A) as a surgical intervention .  The patient's history has been reviewed, patient examined, no change in status, stable for surgery.  I have reviewed the patient's chart and labs.  Questions were answered to the patient's satisfaction.   Cath Lab Visit (complete for each Cath Lab visit)  Clinical Evaluation Leading to the Procedure:   ACS: Yes.    Non-ACS:  N/A  [name]

## 2018-08-28 NOTE — H&P (Addendum)
[employer] [facility] [place] [facility]   PATIENT NAME: Todd Reynolds    MR#:  [id number]  DATE OF BIRTH:  1956/07/05  DATE OF ADMISSION:  08/28/2018  PRIMARY CARE PHYSICIAN: System, Pcp Not In   REQUESTING/REFERRING PHYSICIAN: Dr. Lenard Lance  CHIEF COMPLAINT:   Chief Complaint  Patient presents with  . Chest Pain   Chest pain today. HISTORY OF PRESENT ILLNESS:  [name]  is a 62 y.o. male with no past medical history.  He presented the ED with chest pain since yesterday, which is in the center of left-sided chest, intermittent, dull, 7 out of 10 without radiation.  He also has nausea, diaphoresis today.  But he denies any palpitation, leg edema or shortness of breath.  He is found elevated troponin at 5.24, given aspirin and nitroglycerin in the ED. There is no ST elevation but given the patient's T wave inversions in EKG. He is taken to Cath Lab for urgent cardiac cath and possible PCI by Dr. end. PAST MEDICAL HISTORY:  History reviewed. No pertinent past medical history.  The patient denies any past medical history.  PAST SURGICAL HISTORY:  History reviewed. No pertinent surgical history.  No surgical history.  SOCIAL HISTORY:   Social History   Tobacco Use  . Smoking status: Never Smoker  . Smokeless tobacco: Never Used  Substance Use Topics  . Alcohol use: Never    Frequency: Never    FAMILY HISTORY:   Family History  Problem Relation Age of Onset  . Hypertension Sister   . Heart attack Sister   . Diabetes Sister   . Heart disease Brother     DRUG ALLERGIES:  No Known Allergies  REVIEW OF SYSTEMS:   Review of Systems  Constitutional: Positive for diaphoresis. Negative for chills, fever and malaise/fatigue.  HENT: Negative for sore throat.   Eyes: Negative for blurred vision and double vision.  Respiratory: Negative for cough, hemoptysis, shortness of breath, wheezing and stridor.   Cardiovascular: Positive for chest pain. Negative for  palpitations, orthopnea and leg swelling.  Gastrointestinal: Positive for nausea. Negative for abdominal pain, blood in stool, diarrhea, melena and vomiting.  Genitourinary: Negative for dysuria, flank pain and hematuria.  Musculoskeletal: Negative for back pain and joint pain.  Skin: Negative for rash.  Neurological: Negative for dizziness, sensory change, focal weakness, seizures, loss of consciousness, weakness and headaches.  Endo/Heme/Allergies: Negative for polydipsia.  Psychiatric/Behavioral: Negative for depression. The patient is not nervous/anxious.     MEDICATIONS AT HOME:   Prior to Admission medications   Medication Sig Start Date End Date Taking? Authorizing Provider  hydrOXYzine (ATARAX/VISTARIL) 50 MG tablet Take 1 tablet (50 mg total) by mouth 3 (three) times daily as needed for itching. Patient not taking: Reported on 08/28/2018 03/29/17   Joni Reining, PA-C  methylPREDNISolone (MEDROL DOSEPAK) 4 MG TBPK tablet Take Tapered dose as directed start first dose 03-30-2017. Patient not taking: Reported on 08/28/2018 03/29/17   Joni Reining, PA-C      VITAL SIGNS:  Blood pressure (!) 183/99, pulse 69, temperature 98.3 F (36.8 C), temperature source Oral, resp. rate 16, height 5\' 10"  (1.778 m), weight 81.6 kg, SpO2 96 %.  PHYSICAL EXAMINATION:  Physical Exam  GENERAL:  62 y.o.-year-old patient lying in the bed with no acute distress.  EYES: Pupils equal, round, reactive to light and accommodation. No scleral icterus. Extraocular muscles intact.  HEENT: Head atraumatic, normocephalic. Oropharynx and nasopharynx clear.  NECK:  Supple, no  jugular venous distention. No thyroid enlargement, no tenderness.  LUNGS: Normal breath sounds bilaterally, no wheezing, rales,rhonchi or crepitation. No use of accessory muscles of respiration.  CARDIOVASCULAR: S1, S2 normal. No murmurs, rubs, or gallops.  ABDOMEN: Soft, nontender, nondistended. Bowel sounds present. No organomegaly  or mass.  EXTREMITIES: No pedal edema, cyanosis, or clubbing.  NEUROLOGIC: Cranial nerves II through XII are intact. Muscle strength 5/5 in all extremities. Sensation intact. Gait not checked.  PSYCHIATRIC: The patient is alert and oriented x 3.  SKIN: No obvious rash, lesion, or ulcer.   LABORATORY PANEL:   CBC Recent Labs  Lab 08/28/18 1334  WBC 7.1  HGB 15.0  HCT 47.6  PLT 213   ------------------------------------------------------------------------------------------------------------------  Chemistries  Recent Labs  Lab 08/28/18 1334  NA 140  K 3.2*  CL 105  CO2 27  GLUCOSE 119*  BUN 7  CREATININE 0.98  CALCIUM 9.0   ------------------------------------------------------------------------------------------------------------------  Cardiac Enzymes Recent Labs  Lab 08/28/18 1334  TROPONINI 5.24*   ------------------------------------------------------------------------------------------------------------------  RADIOLOGY:  Dg Chest 2 View  Result Date: 08/28/2018 CLINICAL DATA:  Chest pain EXAM: CHEST - 2 VIEW COMPARISON:  None. FINDINGS: There is no edema or consolidation. Heart is slightly enlarged with pulmonary vascularity normal. No adenopathy. No bone lesions. No pneumothorax. IMPRESSION: Mild cardiac prominence.  No edema or consolidation. Electronically Signed   By: Bretta BangWilliam  Woodruff III M.D.   On: 08/28/2018 14:12      IMPRESSION AND PLAN:   Non-STEMI. The patient will be admitted to stepdown unit. The patient is treated with aspirin and nitroglycerin in the ED, started with heparin drip and getting cardiac cath and PCI now. On Aggrastat, continue aspirin, start Brilinta and Lipitor.  Start lisinopril and Lopressor.  Follow-up troponin level, check lipid panel and hemoglobin A1c.  Hypertension, accelerated.  Start lisinopril and Lopressor.  IV labetalol and hydralazine PRN. Hypokalemia.  Potassium supplement and follow-up BMP.  I discussed with  Dr. Jayme CloudGonzalez, intensivist. All the records are reviewed and case discussed with ED provider. Management plans discussed with the patient, his 2 sisters and they are in agreement.  CODE STATUS: Full code  TOTAL TIME TAKING CARE OF THIS PATIENT: 36 minutes.    Shaune PollackQing Efton Thomley M.D on 08/28/2018 at 5:33 PM  Between 7am to 6pm - Pager - [phone]  After 6pm go to [email or web address] - Social research officer, governmentpassword [email or web address]  [facility]  Office  [phone]  CC: Primary care physician; System, Pcp Not In   Note: This dictation was prepared with Dragon dictation along with smaller phrase technology. Any transcriptional errors that result from this process are unin

## 2018-08-28 NOTE — ED Triage Notes (Signed)
Pt  Comes via POV with c/o mid sternal chest pain that started yesterday.  Pt states some chills, denies any fever, back pain or SHOB.  Pt states he just wants to get it checked out. Pt states 5/10 constant chest pain.

## 2018-08-28 NOTE — OR Nursing (Signed)
Pt became more aggitated. He said I feel like I am going to have a seizure. Severe shaking of both legs to extreme of shaking whole bed. Pt able to briefly stop shaking for 1 minute then resumed. Dr End notified. Versed 1 mg iv given. At 1935 pt attempting to throw legs off bed. Continued extreme shaking with increased heart rate and respirations. Still able to briefly stop shaking of legs on command. Rapid response called. [facility]U charge nurse said to transport pt to ICU. Dr End met [place] in ccu 8 on arrival. Report given to [facility].

## 2018-08-28 NOTE — Progress Notes (Signed)
[facility] Interventional Cardiology  Date: 08/28/18 Time: 8:21 PM  Subjective: Post cath, patient complained of feeling hot and restless.  Upon transfer to ICU, he began having jerky movements of both legs.  He denies chest pain, shortness of breath, and headache.  Objective: Temp:  [98.3 F (36.8 C)-99 F (37.2 C)] 99 F (37.2 C) (12/11 1940) Pulse Rate:  [54-102] 102 (12/11 2005) Resp:  [15-32] 20 (12/11 2005) BP: (140-183)/(89-116) 157/94 (12/11 2005) SpO2:  [96 %-100 %] 99 % (12/11 2005) Weight:  [81.6 kg] 81.6 kg (12/11 1604)  Gen: Agitated. CV: Tachycardic but regular.  No murmurs. Ext: Right radial arteriotomy without bleeding.  UDS: Positive for BDZ and amphetamines.  Assessment/Plan: From cardiac standpoint, patient is now asymptomatic.  However, he has become increasingly agitated in recovery and developed intermittent jerking of both lower extremities.  This stopped when the patient was distracted.  I do not believe that his neuro findings are due to catheterization or medication reaction.  Question if due to drug withdrawal or substance abuse (though patient denies).  I will defer w/u and management of neuro findings to CCM and IM.  If sx worsen, head CT will need to be considered to exclude intracranial hemorrhage in the setting of antithrombotic and antiplatelet therapy (though suspicion is low).  Yvonne Kendallhristopher Wille Aubuchon, MD [facility] Pager: [phone]

## 2018-08-28 NOTE — Progress Notes (Signed)
2000 - pt agitated on arrival.  States that he is unable to "stop moving."  VSS.  Pt stating that is was "the meds they gave me."   Pt able to stop movement on command.   Pt impulsive and attempting/demanding to get OOB.  Pt frequently reminded to not place pressure onto the arm/wrist for his TR band.   The band is at a Level 2.  [facility]ap [facility] into the room (notified by Dr End.) Dr Jayme CloudGonzalez also present.  Precedex gtt started per order.   IV consult placed due to order of Magnesium sulfate.  Sitter at bedside.

## 2018-08-28 NOTE — Consult Note (Signed)
Cardiology Consultation:   Patient ID: Kyzer Ingles MRN: [id number]; DOB: 01-Dec-1955  Admit date: 08/28/2018 Date of Consult: 08/28/2018  Primary Care Provider: System, Pcp Not In Primary Cardiologist: [facility], Dr. Okey Dupre Primary Electrophysiologist:  None    Patient Profile:   Martine Whitfill is a 62 y.o. male with no past cardiac history who is being seen today for the evaluation of chest pain and elevated troponin / NSTEMI  at the request of Dr. Lenard Lance.  History of Present Illness:   Mr. Dowdell is a 89 male with no known cardiac history.  Yesterday, 08/27/2018, he was reportedly doing yard work around 12 PM/1 PM when he felt central, substernal, nonpleuritic, nonradiating chest pressure/pain rated 7-8/10.  Associated symptoms included shortness of breath.  No associated diaphoresis, nausea, abdominal pain, headache, or symptoms of presyncope.  This pain reportedly lasted until he went to sleep that night.  When he awoke in the morning today, 08/28/2018, he reported 3/10 chest pain with associated tingling and decided to report to [facility] ED.  Of note, the patient denied any history of smoking, alcohol, or illegal drug use. He stated he took Dayquil yesterday and prior to doing the yard work. He denied any recent melena or hematuria and not recent bleeding issues. No h/o leg swelling / lower extremity edema. He last ate yesterday evening.   In the ED, he was found to be hypokalemic with stable renal function and elevated troponin. EKG showed t wave inversion and troponin consistent with NSTEMI. He was also noted to be hypertensive and bradycardic. Vitals: BP 147/89, HR 71, RR 18, T98.40F, SpO2 98% Troponin 5.24 Labs: Na 140, K 3.2, glucose 119, Cr 0.98, BUN 7, Ca 9.0, RBC 5.40, Hgb 15.0, plts 213  EKG: SR, 77bpm, Inferolateral t wave inversion / ST changes in inferolateral leads CXR: No edema or consolidation, mild cardiac prominence Meds: Started on nitro and heparin drip. Given ASA 324mg .     At the time of his interview in the emergency department today, he reported minimal chest pain and no further SOB. He denied any furth sx, reporting relief with nitroglycerin.   Troponin 5.24   History reviewed. No pertinent past medical history.  History reviewed. No pertinent surgical history.   Home Medications:  Prior to Admission medications   Medication Sig Start Date End Date Taking? Authorizing Provider  hydrOXYzine (ATARAX/VISTARIL) 50 MG tablet Take 1 tablet (50 mg total) by mouth 3 (three) times daily as needed for itching. Patient not taking: Reported on 08/28/2018 03/29/17   Joni Reining, PA-C  methylPREDNISolone (MEDROL DOSEPAK) 4 MG TBPK tablet Take Tapered dose as directed start first dose 03-30-2017. Patient not taking: Reported on 08/28/2018 03/29/17   Joni Reining, PA-C    Inpatient Medications: Scheduled Meds:  Continuous Infusions: . heparin 1,000 Units/hr (08/28/18 1538)   PRN Meds: nitroGLYCERIN  Allergies:   No Known Allergies  Social History:   Social History   Socioeconomic History  . Marital status: Single    Spouse name: Not on file  . Number of children: Not on file  . Years of education: Not on file  . Highest education level: Not on file  Occupational History  . Not on file  Social Needs  . Financial resource strain: Not on file  . Food insecurity:    Worry: Not on file    Inability: Not on file  . Transportation needs:    Medical: Not on file    Non-medical: Not on file  Tobacco Use  . Smoking status: Never Smoker  . Smokeless tobacco: Never Used  Substance and Sexual Activity  . Alcohol use: Never    Frequency: Never  . Drug use: Never  . Sexual activity: Not on file  Lifestyle  . Physical activity:    Days per week: Not on file    Minutes per session: Not on file  . Stress: Not on file  Relationships  . Social connections:    Talks on phone: Not on file    Gets together: Not on file    Attends religious service:  Not on file    Active member of club or organization: Not on file    Attends meetings of clubs or organizations: Not on file    Relationship status: Not on file  . Intimate partner violence:    Fear of current or ex partner: Not on file    Emotionally abused: Not on file    Physically abused: Not on file    Forced sexual activity: Not on file  Other Topics Concern  . Not on file  Social History Narrative  . Not on file    Family History:   No family history on file.   ROS:  Please see the history of present illness.  Review of Systems  Constitutional: Negative for chills and fever.  Respiratory: Positive for shortness of breath. Negative for hemoptysis and wheezing.   Cardiovascular: Positive for chest pain. Negative for palpitations and leg swelling.       CP improved with nitro  Gastrointestinal: Negative for abdominal pain, blood in stool, melena, nausea and vomiting.  Genitourinary: Negative for flank pain and hematuria.  Neurological: Positive for tingling. Negative for dizziness, tremors, sensory change, speech change, focal weakness, loss of consciousness and headaches.       Tingling CP  Psychiatric/Behavioral: Negative for substance abuse.    All other ROS reviewed and negative.     Physical Exam/Data:   Vitals:   08/28/18 1332 08/28/18 1458 08/28/18 1523  BP: (!) 147/89 (!) 167/99   Pulse: 71 (!) 54 62  Resp: 18 19 15   Temp: 98.7 F (37.1 C)    TempSrc: Oral    SpO2: 98% 100% 100%  Weight: 81.6 kg    Height: 5\' 10"  (1.778 m)     No intake or output data in the 24 hours ending 08/28/18 1552 Filed Weights   08/28/18 1332  Weight: 81.6 kg   Body mass index is 25.83 kg/m.  General:  Well nourished, well developed, in no acute distress HEENT: normal Lymph: no adenopathy Neck: no JVD Endocrine:  No thryomegaly Vascular: No carotid bruits; FA pulses 2+ bilaterally without bruits  Cardiac:  normal S1, S2; sinus bradycardia but regular rhythm; no murmur    Lungs:  clear to auscultation bilaterally, no wheezing, rhonchi or rales  Abd: soft, nontender, no hepatomegaly  Ext: no edema Musculoskeletal:  No deformities, BUE and BLE strength normal and equal Skin: warm and dry  Neuro:  CNs 2-12 intact, no focal abnormalities noted Psych:  Normal affect   EKG:  The EKG was personally reviewed and demonstrates: SR, Inferolateral ST/T changes, TWI Telemetry:  Telemetry was personally reviewed and demonstrates:  Sinus bradycardia with PVCs and rates 50-58bpm  Relevant CV Studies: None pending cath  Laboratory Data:  Chemistry Recent Labs  Lab 08/28/18 1334  NA 140  K 3.2*  CL 105  CO2 27  GLUCOSE 119*  BUN 7  CREATININE 0.98  CALCIUM  9.0  GFRNONAA >60  GFRAA >60  ANIONGAP 8    No results for input(s): PROT, ALBUMIN, AST, ALT, ALKPHOS, BILITOT in the last 168 hours. Hematology Recent Labs  Lab 08/28/18 1334  WBC 7.1  RBC 5.40  HGB 15.0  HCT 47.6  MCV 88.1  MCH 27.8  MCHC 31.5  RDW 13.7  PLT 213   Cardiac Enzymes Recent Labs  Lab 08/28/18 1334  TROPONINI 5.24*   No results for input(s): TROPIPOC in the last 168 hours.  BNPNo results for input(s): BNP, PROBNP in the last 168 hours.  DDimer No results for input(s): DDIMER in the last 168 hours.  Radiology/Studies:  Dg Chest 2 View  Result Date: 08/28/2018 CLINICAL DATA:  Chest pain EXAM: CHEST - 2 VIEW COMPARISON:  None. FINDINGS: There is no edema or consolidation. Heart is slightly enlarged with pulmonary vascularity normal. No adenopathy. No bone lesions. No pneumothorax. IMPRESSION: Mild cardiac prominence.  No edema or consolidation. Electronically Signed   By: Bretta Bang III M.D.   On: 08/28/2018 14:12    Assessment and Plan:   NSTEMI - Troponin 5.24. EKG as above: SR with inferolateral changes - Labs showing stable renal function and Hgb 15.0 - Consented for immediate LHC.  - Further recommendations regarding medical management and repeat EKG  following cardiac catheterization  Hypokalemia - Repletion following catheterization and repeat labs  - Will check Mg following cardiac catheterization  - Daily BMET  Elevated BP - Continue to monitor vitals following cardiac catheterization    For questions or updates, please contact [facility] Please consult [email or web address] for contact info under     Signed, Lennon Alstrom, PA-C  08/28/2018 3:52 PM

## 2018-08-28 NOTE — Progress Notes (Signed)
Advanced Care Plan.  Purpose of Encounter: CODE STATUS. Parties in Attendance: The patient, his 2 sisters, and me. Patient's Decisional Capacity: Yes.Subjective/Patient Story: Medical Story: Todd Reynolds  is a 62 y.o. male with no past medical history.  He is being admitted for non-STEMI and hypertension.  He got a cardiac cath and PCI just now.  I discussed with the patient about his current condition, prognosis and CODE STATUS.  The patient wants to be resuscitated and intubated if he has cardiopulmonary arrest. Plan:  Code Status: Full code.  Time spent discussing advance care planning: 17 minutes.

## 2018-08-29 ENCOUNTER — Other Ambulatory Visit: Payer: Self-pay

## 2018-08-29 ENCOUNTER — Inpatient Hospital Stay (HOSPITAL_COMMUNITY)
Admit: 2018-08-29 | Discharge: 2018-08-29 | Disposition: A | Discharge status: Home / Self Care | Payer: Medicaid Other | Attending: Internal Medicine | Admitting: Internal Medicine

## 2018-08-29 ENCOUNTER — Encounter: Payer: Self-pay | Admitting: Internal Medicine

## 2018-08-29 DIAGNOSIS — I340 Nonrheumatic mitral (valve) insufficiency: Secondary | ICD-10-CM

## 2018-08-29 LAB — BASIC METABOLIC PANEL
ANION GAP: 6 (ref 5–15)
ANION GAP: 6 (ref 5–15)
BUN: 9 mg/dL (ref 6–20)
BUN: 9 mg/dL (ref 6–20)
CALCIUM: 8.8 mg/dL — AB (ref 8.9–10.3)
CO2: 27 mmol/L (ref 22–32)
CO2: 26 mmol/L (ref 22–32)
Calcium: 8.9 mg/dL (ref 8.9–10.3)
Chloride: 107 mmol/L (ref 98–111)
Chloride: 108 mmol/L (ref 98–111)
Creatinine, Ser: 0.96 mg/dL (ref 0.61–1.24)
Creatinine, Ser: 0.92 mg/dL (ref 0.61–1.24)
GFR calc Af Amer: >60 mL/min (ref >60)
GFR calc Af Amer: >60 mL/min (ref >60)
GFR calc non Af Amer: >60 mL/min (ref >60)
GFR calc non Af Amer: >60 mL/min (ref >60)
Glucose, Bld: 122 mg/dL — ABNORMAL HIGH (ref 70–99)
Glucose, Bld: 113 mg/dL — ABNORMAL HIGH (ref 70–99)
POTASSIUM: 4 mmol/L (ref 3.5–5.1)
Potassium: 4.2 mmol/L (ref 3.5–5.1)
Sodium: 140 mmol/L (ref 135–145)
Sodium: 140 mmol/L (ref 135–145)

## 2018-08-29 LAB — CBC
HCT: 45.8 % (ref 39.0–52.0)
Hemoglobin: 14.4 g/dL (ref 13.0–17.0)
MCH: 27.7 pg (ref 26.0–34.0)
MCHC: 31.4 g/dL (ref 30.0–36.0)
MCV: 88.1 fL (ref 80.0–100.0)
Platelets: 209 10*3/uL (ref 150–400)
RBC: 5.2 MIL/uL (ref 4.22–5.81)
RDW: 13.5 % (ref 11.5–15.5)
WBC: 8 10*3/uL (ref 4.0–10.5)
nRBC: 0 % (ref 0.0–0.2)

## 2018-08-29 LAB — LIPID PANEL
Cholesterol: 212 mg/dL — ABNORMAL HIGH (ref 0–200)
HDL: 32 mg/dL — ABNORMAL LOW (ref >40)
LDL Cholesterol: 164 mg/dL — ABNORMAL HIGH (ref 0–99)
Total CHOL/HDL Ratio: 6.6 RATIO
Triglycerides: 79 mg/dL (ref <150)
VLDL: 16 mg/dL (ref 0–40)

## 2018-08-29 LAB — HEMOGLOBIN A1C
Hgb A1c MFr Bld: 5.7 % — ABNORMAL HIGH (ref 4.8–5.6)
Mean Plasma Glucose: 116.89 mg/dL

## 2018-08-29 LAB — ECHOCARDIOGRAM COMPLETE
Height: 70 in
Weight: 2880 oz

## 2018-08-29 LAB — TROPONIN I
Troponin I: 7.65 ng/mL (ref <0.03)
Troponin I: 9.95 ng/mL (ref <0.03)

## 2018-08-29 LAB — MAGNESIUM: MAGNESIUM: 2.2 mg/dL (ref 1.7–2.4)

## 2018-08-29 LAB — GLUCOSE, CAPILLARY: Glucose-Capillary: 112 mg/dL — ABNORMAL HIGH (ref 70–99)

## 2018-08-29 LAB — PHOSPHORUS: Phosphorus: 3.1 mg/dL (ref 2.5–4.6)

## 2018-08-29 MED ORDER — LORAZEPAM 1 MG PO TABS: 1.0000 mg | ORAL_TABLET | ORAL | Status: DC | PRN

## 2018-08-29 MED ORDER — FOLIC ACID 1 MG PO TABS
1.0000 mg | ORAL_TABLET | Freq: Every day | ORAL | Status: DC
  Administered 2018-08-29 – 2018-08-30 (×2): 1 mg via ORAL
  Filled 2018-08-29 (×2): qty 1

## 2018-08-29 MED ORDER — ADULT MULTIVITAMIN W/MINERALS CH
1.0000 | ORAL_TABLET | Freq: Every day | ORAL | Status: DC
  Administered 2018-08-29 – 2018-08-30 (×2): 1 via ORAL
  Filled 2018-08-29 (×2): qty 1

## 2018-08-29 MED ORDER — LORAZEPAM 2 MG/ML IJ SOLN: 1.0000 mg | INTRAMUSCULAR | Status: DC | PRN

## 2018-08-29 MED ORDER — TIROFIBAN HCL IV 12.5 MG/250 ML
0.1500 ug/kg/min | INTRAVENOUS | Status: AC
  Administered 2018-08-29 (×3): 0.15 ug/kg/min via INTRAVENOUS
  Filled 2018-08-29: qty 250

## 2018-08-29 MED ORDER — INFLUENZA VAC SPLIT QUAD 0.5 ML IM SUSY
0.5000 mL | PREFILLED_SYRINGE | INTRAMUSCULAR | Status: AC
  Administered 2018-08-30: 0.5 mL via INTRAMUSCULAR
  Filled 2018-08-29: qty 0.5

## 2018-08-29 MED ORDER — THIAMINE HCL 100 MG/ML IJ SOLN
100.0000 mg | Freq: Every day | INTRAMUSCULAR | Status: DC
  Filled 2018-08-29: qty 2

## 2018-08-29 MED ORDER — VITAMIN B-1 100 MG PO TABS
100.0000 mg | ORAL_TABLET | Freq: Every day | ORAL | Status: DC
  Administered 2018-08-29 – 2018-08-30 (×2): 100 mg via ORAL
  Filled 2018-08-29 (×2): qty 1

## 2018-08-29 NOTE — Progress Notes (Signed)
Report called to Percival SpanishAlana, RN on 2A.  Patient will be moved to room 235.  He has been A&Ox4, VSS and denies chest pain.

## 2018-08-29 NOTE — Consult Note (Signed)
PULMONARY / CRITICAL CARE MEDICINE  Name: Todd Reynolds MRN: [id number] DOB: 06-13-1956    LOS: 1  Referring Provider:  Dr Imogene Burnhen Reason for Referral: ST elevation MI Brief patient description: 62 year old African-American male with a history of polysubstance abuse who presented to the ED with complaints of chest pain, was ruled in for an MI and taken to the Cath Lab for left heart catheterization; found to have multivessel disease with successful PCI to the mid mid left circumflex.  HPI: This is a 62 year old African-American male who presented to the ED with complaints of midsternal chest pain.  His EKG showed changes suggestive of an ST elevation MI.  He was taken to the Cath Lab and found to have multivessel disease.  A drug-eluting stent was placed to the mid left circumflex with thrombectomy.  Postprocedure, patient became agitated.  His urine toxicology was positive for amphetamines, and benzodiazepine.  He was started on a Precedex infusion a sitter was also initiated for safety.  He is currently sleeping and unable to provide any history.  History reviewed. No pertinent past medical history. History reviewed. No pertinent surgical history.   Prior to Admission medications   Medication Sig Start Date End Date Taking? Authorizing Provider  amLODipine (NORVASC) 5 MG tablet Take 5 mg by mouth daily.   Yes [provider]  clopidogrel (PLAVIX) 75 MG tablet Take 75 mg by mouth daily.   Yes [provider]  donepezil (ARICEPT) 5 MG tablet Take 1 tablet (5 mg total) by mouth at bedtime. 05/13/18 06/22/18 Yes Sowles, Danna HeftyKrichna, MD  empagliflozin (JARDIANCE) 25 MG TABS tablet Take 25 mg by mouth daily.   Yes [provider]  glycopyrrolate (ROBINUL) 1 MG tablet Take 1 mg by mouth 2 (two) times daily.   Yes [provider]  insulin aspart (NOVOLOG FLEXPEN) 100 UNIT/ML FlexPen Inject 12 Units into the skin 2 (two) times daily.   Yes [provider]  insulin  aspart (NOVOLOG) 100 UNIT/ML FlexPen Inject 18 Units into the skin daily. At 1700   Yes [provider]  Insulin Degludec-Liraglutide (XULTOPHY) 100-3.6 UNIT-MG/ML SOPN Inject 50 Units into the skin daily.   Yes [provider]  levETIRAcetam (KEPPRA) 500 MG tablet Take 500 mg by mouth 2 (two) times daily.   Yes [provider]  lipase/protease/amylase (CREON) 12000 units CPEP capsule Take 6,000 Units by mouth 3 (three) times daily before meals.   Yes [provider]  lipase/protease/amylase (CREON) 12000 units CPEP capsule Take 3,000 Units by mouth at bedtime. With snack   Yes [provider]  lisinopril (PRINIVIL,ZESTRIL) 5 MG tablet Take 5 mg by mouth daily.   Yes [provider]  metoprolol succinate (TOPROL-XL) 25 MG 24 hr tablet Take 1 tablet (25 mg total) by mouth daily. 05/13/18  Yes Sowles, Danna HeftyKrichna, MD  rosuvastatin (CRESTOR) 40 MG tablet Take 1 tablet (40 mg total) by mouth daily. 05/13/18 06/22/18 Yes Alba CorySowles, Krichna, MD  aspirin EC 81 MG tablet Take 81 mg by mouth daily.    [provider]  famotidine (PEPCID) 20 MG tablet Take 1 tablet (20 mg total) by mouth 2 (two) times daily. 05/13/18 06/12/18  Alba CorySowles, Krichna, MD  gabapentin (NEURONTIN) 300 MG capsule Take 1 capsule (300 mg total) by mouth 2 (two) times daily. 05/13/18 06/12/18  Alba CorySowles, Krichna, MD  insulin glargine (LANTUS) 100 UNIT/ML injection Inject 0.1 mLs (10 Units total) into the skin daily. 05/13/18 06/12/18  Alba CorySowles, Krichna, MD  lacosamide 100 MG TABS  Take 1 tablet (100 mg total) by mouth 2 (two) times daily. Patient not taking: Reported on 06/22/2018 09/28/17   Enedina Finner, MD  promethazine (PHENERGAN) 12.5 MG tablet Take 1 tablet (12.5 mg total) by mouth every 6 (six) hours as needed for nausea or vomiting. Patient not taking: Reported on 06/22/2018 07/17/17   Almond Lint, MD  sertraline (ZOLOFT) 25 MG tablet Take 1 tablet (25 mg total) by mouth daily. Patient not  taking: Reported on 06/22/2018 05/13/18   Alba Cory, MD   Allergies No Known Allergies  Family History Family History  Problem Relation Age of Onset  . Hypertension Sister   . Heart attack Sister   . Diabetes Sister   . Heart disease Brother    Social History  reports that he has never smoked. He has never used smokeless tobacco. He reports that he does not drink alcohol or use drugs.  Review Of Systems: Unable to obtain due to patient's mental status  VITAL SIGNS: BP 110/77   Pulse (!) 51   Temp (!) 97.3 F (36.3 C) (Axillary)   Resp (!) 21   Ht 5\' 10"  (1.778 m)   Wt 81.6 kg   SpO2 96%   BMI 25.83 kg/m   HEMODYNAMICS:    VENTILATOR SETTINGS:    INTAKE / OUTPUT: I/O last 3 completed shifts: In: -  Out: 250 [Urine:250]  PHYSICAL EXAMINATION: General: Sleeping calmly, in no distress HEENT: PERRLA, trachea midline, no JVD Neuro: Awakens to verbal noxious stimulus, not following commands Cardiovascular: Apical pulse mildly bradycardic with heart rate in the 50s, blood pressure normal, S1-S2, no murmur regurg or gallop, +2 pulses bilaterally Lungs: Clear to auscultation bilaterally, diminished in the bases Abdomen: Nondistended and nontender, positive bowel sounds in all 4 quadrants, palpation reveals no organomegaly Musculoskeletal: No joint deformity Skin: Warm and dry  LABS:  BMET Recent Labs  Lab 08/28/18 1334 08/29/18 0300 08/29/18 0521  NA 140 140 140  K 3.2* 4.2 4.0  CL 105 107 108  CO2 27 27 26   BUN 7 9 9   CREATININE 0.98 0.96 0.92  GLUCOSE 119* 122* 113*    Electrolytes Recent Labs  Lab 08/28/18 1334 08/28/18 2116 08/29/18 0300 08/29/18 0521  CALCIUM 9.0  --  8.9 8.8*  MG  --  1.9  --  2.2  PHOS  --   --   --  3.1    CBC Recent Labs  Lab 08/28/18 1334 08/29/18 0300  WBC 7.1 8.0  HGB 15.0 14.4  HCT 47.6 45.8  PLT 213 209    Coag's Recent Labs  Lab 08/28/18 1530  APTT 29  INR 0.99    Sepsis Markers No results  for input(s): LATICACIDVEN, PROCALCITON, O2SATVEN in the last 168 hours.  ABG No results for input(s): PHART, PCO2ART, PO2ART in the last 168 hours.  Liver Enzymes No results for input(s): AST, ALT, ALKPHOS, BILITOT, ALBUMIN in the last 168 hours.  Cardiac Enzymes Recent Labs  Lab 08/28/18 1334 08/28/18 2116 08/29/18 0300  TROPONINI 5.24* 6.41* 7.65*    Glucose No results for input(s): GLUCAP in the last 168 hours.  Imaging Dg Chest 2 View  Result Date: 08/28/2018 CLINICAL DATA:  Chest pain EXAM: CHEST - 2 VIEW COMPARISON:  None. FINDINGS: There is no edema or consolidation. Heart is slightly enlarged with pulmonary vascularity normal. No adenopathy. No bone lesions. No pneumothorax. IMPRESSION: Mild cardiac prominence.  No edema or consolidation. Electronically Signed   By: Bretta Bang III M.D.  On: 08/28/2018 14:12    STUDIES:  2D echo pending  ASSESSMENT / PLAN: ST elevation MI s/p left heart catheterization with PCI and stent placement to mid left circumflex: Treatment plan per cardiology  Acute withdrawal from amphetamines: Precedex infusion and sitter at bedside.  Also give magnesium as needed and lorazepam as needed.  Also initiate CIWA protocol  Resume all home medications  If no acute issues overnight, patient will be stable for transfer to telemetry Best Practice: Code Status: Full code Diet: Heart healthy GI prophylaxis: Not indicated VTE prophylaxis: Lovenox subcu  FAMILY  - Updates: No family at bedside.  Will update when available  Jarod Bozzo S. [facility] ANP-BC Pulmonary and Critical Care Medicine [facility] Pager [phone] or [phone]  NB: This document was prepared using Dragon voice recognition software and may include unintentional dictation errors.    08/29/2018, 6:29 AM

## 2018-08-29 NOTE — Progress Notes (Signed)
*  PRELIMINARY RESULTS* Echocardiogram 2D Echocardiogram has been performed.  Todd Reynolds 08/29/2018, 8:59 AM

## 2018-08-29 NOTE — Progress Notes (Signed)
Progress Note  Patient Name: Todd Reynolds Date of Encounter: 08/29/2018  Primary Cardiologist: new (Dr. Okey Dupre)  Subjective   The patient underwent cardiac catheterization and stenting of the left circumflex yesterday.  The vessel was occluded with thrombus and evidence of embolization in 2013.  He was placed on Aggrastat.  He had agitation and confusion after the procedure that was felt to be due to possibly amphetamine withdrawal.  He was placed on Precedex drip and has done well overnight.  He is alert and oriented this morning and denies any chest pain or shortness of breath.  Inpatient Medications    Scheduled Meds: . aspirin EC  81 mg Oral Daily  . atorvastatin  40 mg Oral q1800  . enoxaparin (LOVENOX) injection  40 mg Subcutaneous Q24H  . folic acid  1 mg Oral Daily  . lisinopril  5 mg Oral Daily  . metoprolol tartrate  12.5 mg Oral BID  . multivitamin with minerals  1 tablet Oral Daily  . sodium chloride flush  3 mL Intravenous Q12H  . thiamine  100 mg Oral Daily   Or  . thiamine  100 mg Intravenous Daily  . ticagrelor  90 mg Oral BID   Continuous Infusions: . sodium chloride 5 mL/hr at 08/29/18 0500  . dexmedetomidine (PRECEDEX) IV infusion Stopped (08/29/18 1610)  . tirofiban 0.15 mcg/kg/min (08/29/18 0617)   PRN Meds: sodium chloride, acetaminophen, hydrALAZINE, LORazepam **OR** LORazepam, nitroGLYCERIN, ondansetron (ZOFRAN) IV, sodium chloride flush, zolpidem   Vital Signs    Vitals:   08/29/18 0300 08/29/18 0400 08/29/18 0500 08/29/18 0600  BP: 120/81 118/79 115/75 110/77  Pulse: (!) 56 (!) 51 (!) 52 (!) 51  Resp: 15 17 19  (!) 21  Temp:      TempSrc:      SpO2: 97% 97% 97% 96%  Weight:      Height:        Intake/Output Summary (Last 24 hours) at 08/29/2018 0836 Last data filed at 08/29/2018 0700 Gross per 24 hour  Intake 709.24 ml  Output 975 ml  Net -265.76 ml   Filed Weights   08/28/18 1332 08/28/18 1604  Weight: 81.6 kg 81.6 kg    Telemetry     Normal sinus rhythm with heart rate in the 60s- Personally Reviewed  ECG     - Personally Reviewed  Physical Exam   GEN: No acute distress.   Neck: No JVD Cardiac: RRR, no murmurs, rubs, or gallops.  Respiratory: Clear to auscultation bilaterally. GI: Soft, nontender, non-distended  MS: No edema; No deformity. Neuro:  Nonfocal  Psych: Normal affect  Right radial pulses normal with no hematoma.  Labs    Chemistry Recent Labs  Lab 08/28/18 1334 08/29/18 0300 08/29/18 0521  NA 140 140 140  K 3.2* 4.2 4.0  CL 105 107 108  CO2 27 27 26   GLUCOSE 119* 122* 113*  BUN 7 9 9   CREATININE 0.98 0.96 0.92  CALCIUM 9.0 8.9 8.8*  GFRNONAA >60 >60 >60  GFRAA >60 >60 >60  ANIONGAP 8 6 6      Hematology Recent Labs  Lab 08/28/18 1334 08/29/18 0300  WBC 7.1 8.0  RBC 5.40 5.20  HGB 15.0 14.4  HCT 47.6 45.8  MCV 88.1 88.1  MCH 27.8 27.7  MCHC 31.5 31.4  RDW 13.7 13.5  PLT 213 209    Cardiac Enzymes Recent Labs  Lab 08/28/18 1334 08/28/18 2116 08/29/18 0300  TROPONINI 5.24* 6.41* 7.65*   No results for  input(s): TROPIPOC in the last 168 hours.   BNPNo results for input(s): BNP, PROBNP in the last 168 hours.   DDimer No results for input(s): DDIMER in the last 168 hours.   Radiology    Dg Chest 2 View  Result Date: 08/28/2018 CLINICAL DATA:  Chest pain EXAM: CHEST - 2 VIEW COMPARISON:  None. FINDINGS: There is no edema or consolidation. Heart is slightly enlarged with pulmonary vascularity normal. No adenopathy. No bone lesions. No pneumothorax. IMPRESSION: Mild cardiac prominence.  No edema or consolidation. Electronically Signed   By: Bretta BangWilliam  Woodruff III M.D.   On: 08/28/2018 14:12    Cardiac Studies   Echocardiogram is pending.  Patient Profile     62 y.o. male  with no past cardiac history who presented yesterday with non-ST elevation myocardial infarction.     Assessment & Plan    1.  Non-ST elevation myocardial infarction: The patient  underwent successful PCI and drug-eluting stent placement to the left circumflex with embolization into OM 3.  The patient was started on Aggrastat infusion which will be stopped today.  Continue dual antiplatelet therapy with aspirin and Brilinta. The patient can be transferred to telemetry today. Continue aggressive treatment of risk factors. I personally reviewed his cardiac catheterization images and agree that he has significant diagonal, ramus and PDA disease.  However, these are relatively small branches with diameter of 2 mm or less.  I favor continued medical therapy unless the patient has refractory anginal symptoms.  2.  Confusion: Resolved completely and possibly was related to amphetamine.      For questions or updates, please contact [facility] Please consult [email or web address] for contact info under        Signed, Lorine BearsMuhammad Trevaughn Schear, MD  08/29/2018, 8:36 AM

## 2018-08-29 NOTE — Progress Notes (Signed)
Patient moved to room 235 by wheelchair with this RN on tele monitor.  Alana, RN at bedside and both RN's verified Aggrastat drip on new pump.

## 2018-08-29 NOTE — Progress Notes (Signed)
Pt unable to tolerate IV potassium runs.  Has completed 1.5 runs in 4 hours. Potassium infusing at 0300 lab draw.  Notified Luci Bankukov, NP.  Orders received.

## 2018-08-29 NOTE — Progress Notes (Addendum)
[facility] [place] at [facility]   PATIENT NAME: Todd AlstromMaurice Reynolds    MR#:  [id number]  DATE OF BIRTH:  Mar 15, 1956  SUBJECTIVE:  CHIEF COMPLAINT:   Chief Complaint  Patient presents with  . Chest Pain   The patient has no complaints. REVIEW OF SYSTEMS:  Review of Systems  Constitutional: Negative for chills, fever and malaise/fatigue.  HENT: Negative for sore throat.   Eyes: Negative for blurred vision and double vision.  Respiratory: Negative for cough, hemoptysis, shortness of breath, wheezing and stridor.   Cardiovascular: Negative for chest pain, palpitations, orthopnea and leg swelling.  Gastrointestinal: Negative for abdominal pain, blood in stool, diarrhea, melena, nausea and vomiting.  Genitourinary: Negative for dysuria, flank pain and hematuria.  Musculoskeletal: Negative for back pain and joint pain.  Skin: Negative for rash.  Neurological: Negative for dizziness, sensory change, focal weakness, seizures, loss of consciousness, weakness and headaches.  Endo/Heme/Allergies: Negative for polydipsia.  Psychiatric/Behavioral: Negative for depression. The patient is not nervous/anxious.     DRUG ALLERGIES:  No Known Allergies VITALS:  Blood pressure 128/80, pulse 90, temperature 99.4 F (37.4 C), temperature source Oral, resp. rate 20, height 5\' 10"  (1.778 m), weight 81.6 kg, SpO2 99 %. PHYSICAL EXAMINATION:  Physical Exam Constitutional:      General: He is not in acute distress. HENT:     Head: Normocephalic.     Mouth/Throat:     Mouth: Mucous membranes are moist.  Eyes:     General: No scleral icterus.    Extraocular Movements: Extraocular movements intact.     Conjunctiva/sclera: Conjunctivae normal.     Pupils: Pupils are equal, round, and reactive to light.  Neck:     Musculoskeletal: Normal range of motion and neck supple.     Vascular: No JVD.     Trachea: No tracheal deviation.  Cardiovascular:     Rate and Rhythm: Normal rate and  regular rhythm.     Heart sounds: Normal heart sounds. No murmur. No gallop.   Pulmonary:     Effort: Pulmonary effort is normal. No respiratory distress.     Breath sounds: Normal breath sounds. No wheezing or rales.  Abdominal:     General: Bowel sounds are normal. There is no distension.     Palpations: Abdomen is soft.     Tenderness: There is no abdominal tenderness. There is no rebound.  Musculoskeletal: Normal range of motion.        General: No tenderness.  Skin:    Findings: No erythema or rash.  Neurological:     Mental Status: He is alert and oriented to person, place, and time.     Cranial Nerves: No cranial nerve deficit.  Psychiatric:        Mood and Affect: Mood normal.    LABORATORY PANEL:  Male CBC Recent Labs  Lab 08/29/18 0300  WBC 8.0  HGB 14.4  HCT 45.8  PLT 209   ------------------------------------------------------------------------------------------------------------------ Chemistries  Recent Labs  Lab 08/29/18 0521  NA 140  K 4.0  CL 108  CO2 26  GLUCOSE 113*  BUN 9  CREATININE 0.92  CALCIUM 8.8*  MG 2.2   RADIOLOGY:  No results found. ASSESSMENT AND PLAN:   Non-STEMI. The patient is treated with aspirin and nitroglycerin in the ED, started heparin drip. He got cardiac cath and PCI with 1 stent placement.   He is on Aggrastat after cardiac cath, continue aspirin,  Brilinta and Lipitor.  Started lisinopril and  Lopressor.  Echo: LV EF: 50% -   55%.  Normal hemoglobin A1c. Per Dr. Kirke Corin, continued medical therapy unless the patient has refractory anginal symptoms.  Hypertension, accelerated.    Controlled with lisinopril and Lopressor.  IV labetalol and hydralazine PRN.  Hyperlipidemia.  LDL 164.  Continue Lipitor.  The patient is advised diet control and exercise.  Hypokalemia.    Improved with potassium supplement.  All the records are reviewed and case discussed with Care Management/Social Worker. Management plans discussed  with the patient, family and they are in agreement.  CODE STATUS: Full Code  TOTAL TIME TAKING CARE OF THIS PATIENT: 26 minutes.   More than 50% of the time was spent in counseling/coordination of care: YES  POSSIBLE D/C IN 1-2 DAYS, DEPENDING ON CLINICAL CONDITION.   Shaune Pollack M.D on 08/29/2018 at 5:02 PM  Between 7am to 6pm - Pager - [phone]  After 6pm go to [email or web address] - Therapist, nutritional Hospitalists

## 2018-08-29 NOTE — Clinical Social Work Note (Signed)
CSW received consult for patient needing assistance with medications, CSW notified case Production designer, theatre/television/filmmanager.  CSW signing off, please consult if social work needs arise.  Ervin KnackEric R. Tyrel Lex, MSW, Theresia MajorsLCSWA [phone]  08/29/2018 3:31 PM

## 2018-08-30 ENCOUNTER — Telehealth: Payer: Self-pay

## 2018-08-30 ENCOUNTER — Other Ambulatory Visit: Payer: Self-pay | Admitting: Physician Assistant

## 2018-08-30 DIAGNOSIS — I214 Non-ST elevation (NSTEMI) myocardial infarction: Secondary | ICD-10-CM

## 2018-08-30 LAB — HIV ANTIBODY (ROUTINE TESTING W REFLEX): HIV Screen 4th Generation wRfx: NONREACTIVE

## 2018-08-30 MED ORDER — ATORVASTATIN CALCIUM 40 MG PO TABS: 40.0000 mg | ORAL_TABLET | Freq: Every day | ORAL | 2 refills | Status: DC

## 2018-08-30 MED ORDER — CLOPIDOGREL BISULFATE 75 MG PO TABS: 75.0000 mg | ORAL_TABLET | Freq: Every day | ORAL | 11 refills | Status: DC

## 2018-08-30 MED ORDER — TICAGRELOR 90 MG PO TABS: 90.0000 mg | ORAL_TABLET | Freq: Two times a day (BID) | ORAL | 1 refills | Status: DC

## 2018-08-30 MED ORDER — LISINOPRIL 5 MG PO TABS: 5.0000 mg | ORAL_TABLET | Freq: Every day | ORAL | 1 refills | Status: DC

## 2018-08-30 MED ORDER — NITROGLYCERIN 0.4 MG SL SUBL: 0.4000 mg | SUBLINGUAL_TABLET | SUBLINGUAL | 2 refills | Status: DC | PRN

## 2018-08-30 MED ORDER — ASPIRIN 81 MG PO TBEC: 81.0000 mg | DELAYED_RELEASE_TABLET | Freq: Every day | ORAL | 2 refills | Status: DC

## 2018-08-30 MED ORDER — METOPROLOL TARTRATE 25 MG PO TABS: 12.5000 mg | ORAL_TABLET | Freq: Two times a day (BID) | ORAL | 1 refills | Status: DC

## 2018-08-30 NOTE — Discharge Summary (Signed)
[facility] - [place] at [facility]   PATIENT NAME: Todd AlstromMaurice Reynolds    MR#:  [id number]  DATE OF BIRTH:  03/04/1956  DATE OF ADMISSION:  08/28/2018   ADMITTING PHYSICIAN: Shaune PollackQing Mathew Postiglione, MD  DATE OF DISCHARGE: 08/30/2018  1:07 PM  PRIMARY CARE PHYSICIAN: No primary care provider on file.   ADMISSION DIAGNOSIS:  NSTEMI (non-ST elevated myocardial infarction) (HCC) [I21.4] DISCHARGE DIAGNOSIS:  Active Problems:   NSTEMI (non-ST elevated myocardial infarction) (HCC)   Non-ST elevation (NSTEMI) myocardial infarction (HCC)  SECONDARY DIAGNOSIS:  History reviewed. No pertinent past medical history. HOSPITAL COURSE:  Non-STEMI. The patient is treated with aspirinand nitroglycerinin the ED, started heparin drip. He got cardiac cath and PCI with 1 stent placement.   He is onAggrastat after cardiac cath, continue aspirin,  Brilinta and Lipitor. Started lisinopril and Lopressor. Echo: LV EF: 50% - 55%.  Normal hemoglobin A1c. Per Dr. Kirke CorinArida, continued medical therapy unless the patient has refractory anginal symptoms.  Hypertension,accelerated.   Controlled with lisinopril and Lopressor.IVlabetalol andhydralazine PRN.  Hyperlipidemia.  LDL 164.  Continue Lipitor.  The patient is advised diet control and exercise. Hypokalemia.   Improved with potassium supplement. I discussed with Dr. Kirke CorinArida. DISCHARGE CONDITIONS:  Stable, discharged to home today. CONSULTS OBTAINED:  Treatment Team:  Yvonne KendallEnd, Christopher, MD DRUG ALLERGIES:  No Known Allergies DISCHARGE MEDICATIONS:   Allergies as of 08/30/2018   No Known Allergies     Medication List    STOP taking these medications   hydrOXYzine 50 MG tablet Commonly known as:  ATARAX/VISTARIL   methylPREDNISolone 4 MG Tbpk tablet Commonly known as:  MEDROL DOSEPAK     TAKE these medications   aspirin 81 MG EC tablet Take 1 tablet (81 mg total) by mouth daily.   atorvastatin 40 MG tablet Commonly known as:   LIPITOR Take 1 tablet (40 mg total) by mouth daily at 6 PM.   lisinopril 5 MG tablet Commonly known as:  PRINIVIL,ZESTRIL Take 1 tablet (5 mg total) by mouth daily.   metoprolol tartrate 25 MG tablet Commonly known as:  LOPRESSOR Take 0.5 tablets (12.5 mg total) by mouth 2 (two) times daily.   nitroGLYCERIN 0.4 MG SL tablet Commonly known as:  NITROSTAT Place 1 tablet (0.4 mg total) under the tongue every 5 (five) minutes as needed for chest pain.        DISCHARGE INSTRUCTIONS:  See AVS. If you experience worsening of your admission symptoms, develop shortness of breath, life threatening emergency, suicidal or homicidal thoughts you must seek medical attention immediately by calling 911 or calling your MD immediately  if symptoms less severe.  You Must read complete instructions/literature along with all the possible adverse reactions/side effects for all the Medicines you take and that have been prescribed to you. Take any new Medicines after you have completely understood and accpet all the possible adverse reactions/side effects.   Please note  You were cared for by a hospitalist during your hospital stay. If you have any questions about your discharge medications or the care you received while you were in the hospital after you are discharged, you can call the unit and asked to speak with the hospitalist on call if the hospitalist that took care of you is not available. Once you are discharged, your primary care physician will handle any further medical issues. Please note that NO REFILLS for any discharge medications will be authorized once you are discharged, as it is imperative that you return to  your primary care physician (or establish a relationship with a primary care physician if you do not have one) for your aftercare needs so that they can reassess your need for medications and monitor your lab values.    On the [place] of Discharge:  VITAL SIGNS:  Blood pressure 131/85,  pulse 71, temperature 98.8 F (37.1 C), temperature source Oral, resp. rate 19, height 5\' 10"  (1.778 m), weight 81.6 kg, SpO2 99 %. PHYSICAL EXAMINATION:  GENERAL:  62 y.o.-year-old patient lying in the bed with no acute distress.  EYES: Pupils equal, round, reactive to light and accommodation. No scleral icterus. Extraocular muscles intact.  HEENT: Head atraumatic, normocephalic. Oropharynx and nasopharynx clear.  NECK:  Supple, no jugular venous distention. No thyroid enlargement, no tenderness.  LUNGS: Normal breath sounds bilaterally, no wheezing, rales,rhonchi or crepitation. No use of accessory muscles of respiration.  CARDIOVASCULAR: S1, S2 normal. No murmurs, rubs, or gallops.  ABDOMEN: Soft, non-tender, non-distended. Bowel sounds present. No organomegaly or mass.  EXTREMITIES: No pedal edema, cyanosis, or clubbing.  NEUROLOGIC: Cranial nerves II through XII are intact. Muscle strength 5/5 in all extremities. Sensation intact. Gait not checked.  PSYCHIATRIC: The patient is alert and oriented x 3.  SKIN: No obvious rash, lesion, or ulcer.  DATA REVIEW:   CBC Recent Labs  Lab 08/29/18 0300  WBC 8.0  HGB 14.4  HCT 45.8  PLT 209    Chemistries  Recent Labs  Lab 08/29/18 0521  NA 140  K 4.0  CL 108  CO2 26  GLUCOSE 113*  BUN 9  CREATININE 0.92  CALCIUM 8.8*  MG 2.2     Microbiology Results  Results for orders placed or performed during the hospital encounter of 08/28/18  MRSA PCR Screening     Status: None   Collection Time: 08/28/18  9:17 PM  Result Value Ref Range Status   MRSA by PCR NEGATIVE NEGATIVE Final    Comment:        The GeneXpert MRSA Assay (FDA approved for NASAL specimens only), is one component of a comprehensive MRSA colonization surveillance program. It is not intended to diagnose MRSA infection nor to guide or monitor treatment for MRSA infections. Performed at [facility], [address] [place], [place] [address]      RADIOLOGY:  No results found.   Management plans discussed with the patient, family and they are in agreement.  CODE STATUS: Prior   TOTAL TIME TAKING CARE OF THIS PATIENT: 32 minutes.    Shaune Pollack M.D on 08/30/2018 at 6:48 PM  Between 7am to 6pm - Pager - [phone]  After 6pm go to [email or web address] - Scientist, research (life sciences) [place] [facility]  Office  [phone]  CC: Primary care physician; No primary care provider on file.   Note: This dictation was prepared with Dragon dictation along with smaller phrase technology. Any transcriptional errors that result from this process are unintentional.

## 2018-08-30 NOTE — Telephone Encounter (Signed)
-----   Message from Iran OuchMuhammad A Arida, MD sent at 08/30/2018  9:19 AM EST ----- This patient is being discharged home later today after non-STEMI and stenting done by by Dr. Okey DupreEnd.  TCM follow-up needed in 1 to 2 weeks. The patient does not have any health insurance and we arranged for him to get his medications through medication management.  He will get Brilinta for 1 month free with a coupon and then we should consider switching him to generic Plavix.

## 2018-08-30 NOTE — Progress Notes (Signed)
Pt to be discharged this afternoon. Iv's (3) and tele removed. disch instructions and prescrips given to pt and wife. To be discharged via w.c. to awaiting auto accompanied by family

## 2018-08-30 NOTE — Progress Notes (Signed)
Progress Note  Patient Name: Todd Reynolds Date of Encounter: 08/30/2018  Primary Cardiologist: new (Dr. Okey Dupre)  Subjective   The patient feels well today with no chest pain, shortness of breath or palpitations.  Inpatient Medications    Scheduled Meds: . aspirin EC  81 mg Oral Daily  . atorvastatin  40 mg Oral q1800  . enoxaparin (LOVENOX) injection  40 mg Subcutaneous Q24H  . folic acid  1 mg Oral Daily  . Influenza vac split quadrivalent PF  0.5 mL Intramuscular Tomorrow-1000  . lisinopril  5 mg Oral Daily  . metoprolol tartrate  12.5 mg Oral BID  . multivitamin with minerals  1 tablet Oral Daily  . sodium chloride flush  3 mL Intravenous Q12H  . thiamine  100 mg Oral Daily   Or  . thiamine  100 mg Intravenous Daily  . ticagrelor  90 mg Oral BID   Continuous Infusions: . sodium chloride Stopped (08/29/18 1153)   PRN Meds: sodium chloride, acetaminophen, hydrALAZINE, LORazepam **OR** LORazepam, nitroGLYCERIN, ondansetron (ZOFRAN) IV, sodium chloride flush, zolpidem   Vital Signs    Vitals:   08/29/18 1628 08/29/18 2007 08/30/18 0529 08/30/18 0814  BP: 128/80 128/81 101/84 131/85  Pulse: 90 89 78 71  Resp: 20 18 18 19   Temp: 99.4 F (37.4 C) 99.6 F (37.6 C) 99.2 F (37.3 C) 98.8 F (37.1 C)  TempSrc: Oral Oral Oral Oral  SpO2: 99% 98% 100% 99%  Weight:      Height:        Intake/Output Summary (Last 24 hours) at 08/30/2018 0913 Last data filed at 08/30/2018 0837 Gross per 24 hour  Intake 469.97 ml  Output 1300 ml  Net -830.03 ml   Filed Weights   08/28/18 1332 08/28/18 1604  Weight: 81.6 kg 81.6 kg    Telemetry    Normal sinus rhythm with heart rate in the 60s- Personally Reviewed  ECG     - Personally Reviewed  Physical Exam   GEN: No acute distress.   Neck: No JVD Cardiac: RRR, no murmurs, rubs, or gallops.  Respiratory: Clear to auscultation bilaterally. GI: Soft, nontender, non-distended  MS: No edema; No deformity. Neuro:   Nonfocal  Psych: Normal affect  Right radial pulses normal with no hematoma.  Labs    Chemistry Recent Labs  Lab 08/28/18 1334 08/29/18 0300 08/29/18 0521  NA 140 140 140  K 3.2* 4.2 4.0  CL 105 107 108  CO2 27 27 26   GLUCOSE 119* 122* 113*  BUN 7 9 9   CREATININE 0.98 0.96 0.92  CALCIUM 9.0 8.9 8.8*  GFRNONAA >60 >60 >60  GFRAA >60 >60 >60  ANIONGAP 8 6 6      Hematology Recent Labs  Lab 08/28/18 1334 08/29/18 0300  WBC 7.1 8.0  RBC 5.40 5.20  HGB 15.0 14.4  HCT 47.6 45.8  MCV 88.1 88.1  MCH 27.8 27.7  MCHC 31.5 31.4  RDW 13.7 13.5  PLT 213 209    Cardiac Enzymes Recent Labs  Lab 08/28/18 1334 08/28/18 2116 08/29/18 0300 08/29/18 0910  TROPONINI 5.24* 6.41* 7.65* 9.95*   No results for input(s): TROPIPOC in the last 168 hours.   BNPNo results for input(s): BNP, PROBNP in the last 168 hours.   DDimer No results for input(s): DDIMER in the last 168 hours.   Radiology    Dg Chest 2 View  Result Date: 08/28/2018 CLINICAL DATA:  Chest pain EXAM: CHEST - 2 VIEW COMPARISON:  None. FINDINGS:  There is no edema or consolidation. Heart is slightly enlarged with pulmonary vascularity normal. No adenopathy. No bone lesions. No pneumothorax. IMPRESSION: Mild cardiac prominence.  No edema or consolidation. Electronically Signed   By: Bretta BangWilliam  Woodruff III M.D.   On: 08/28/2018 14:12    Cardiac Studies   Echocardiogram is pending.  Patient Profile     62 y.o. male  with no past cardiac history who presented yesterday with non-ST elevation myocardial infarction.     Assessment & Plan    1.  Non-ST elevation myocardial infarction: The patient underwent successful PCI and drug-eluting stent placement to the left circumflex with embolization into OM 3 which was treated with Aggrastat. Continue dual antiplatelet therapy with aspirin and Brilinta. Continue aggressive treatment of risk factors. I personally reviewed his cardiac catheterization images and agree that  he has significant diagonal, ramus and PDA disease.  However, these are relatively small branches with diameter of 2 mm or less.  I favor continued medical therapy unless the patient has refractory anginal symptoms.  2.  Confusion: Resolved completely and possibly was related to amphetamine.  3.  Hyperlipidemia: Continue treatment with atorvastatin.  4.  Social: The patient does not have health insurance.  He will need assistance with his medications this was communicated with the case manager.  We also need to make sure that he will be able to get Brilinta for at least 1 month and then we can consider switching him to generic Plavix after.  The patient otherwise can be discharged home from a cardiac standpoint.  I will arrange for follow-up with Dr. Okey DupreEnd in 1 to 2 weeks.     For questions or updates, please contact [facility] Please consult [email or web address] for contact info under        Signed, Lorine BearsMuhammad Deaisa Merida, MD  08/30/2018, 9:13 AM

## 2018-08-30 NOTE — Telephone Encounter (Signed)
Patient discharged today. TCM call- 12/16.

## 2018-08-30 NOTE — Discharge Instructions (Signed)
Diet control Heart healthy diet.

## 2018-08-30 NOTE — Care Management Note (Signed)
Case Management Note  Patient Details  Name: Todd Reynolds MRN: [id number] Date of Birth: [birth year]  Subjective/Objective:   Patient is from home alone.  Presented with NSTEMI.  No insurance or PCP.  GAve patient [address] free Brilinta coupon.  Dr. Kirke CorinArida would like patient to take Brilinta x 2 weeks and then follow up with him and change to plavix as it is more affordable.  RNCM [facility].  [facility] and [facility] and establish with a PCP there.  If the need arises to change back to Brilinta, once he is established they can order it for him.  He in independent in all ADL's [facility] [address]  Can afford them if on the $4 dollar list.  He work odd end jobs.  No further needs identified at this time.                   Action/Plan:   Expected Discharge Date:  08/30/18               Expected Discharge Plan:  Home/Self Care  In-House Referral:     Discharge planning Services  CM Consult, Medication Assistance  Post Acute Care Choice:    Choice offered to:     DME Arranged:    DME Agency:     HH Arranged:    HH Agency:     Status of Service:  Completed, signed off  If discussed at [employer] Length of Stay Meetings, dates discussed:    Additional Comments:  Sherren KernsJennifer L Price [place], RN 08/30/2018, 9:49 AM

## 2018-08-30 NOTE — Telephone Encounter (Signed)
TCM....  Patient is being discharged   End Stented   They are scheduled to see Brion AlimentBerge 12/26   They were seen for NSTEMI  They need to be seen within  1-2 weeks

## 2018-08-30 NOTE — Progress Notes (Signed)
   Ticagrelor 90mg  tabs unable to be filled, because patient does not have insurance, so he could not use the [address] free card for his 2 week prescription.   After consulting with the pharmacy and Dr. Kirke CorinArida, the prescription was changed from ticarelor to plavix. The patient and pharmacy was called and notified of the change to plavix.  Marisue IvanJacquelyn Schylar Wuebker, PA-C

## 2018-08-30 NOTE — Plan of Care (Signed)
Nutrition Education Note  RD consulted for nutrition education regarding a Heart Healthy diet.   Lipid Panel     Component Value Date/Time   CHOL 212 (H) 08/29/2018 0300   TRIG 79 08/29/2018 0300   HDL 32 (L) 08/29/2018 0300   CHOLHDL 6.6 08/29/2018 0300   VLDL 16 08/29/2018 0300   LDLCALC 164 (H) 08/29/2018 0300    RD provided "Heart Healthy Nutrition Therapy" handout from the Academy of Nutrition and Dietetics. Reviewed patient's dietary recall. Provided examples on ways to decrease sodium and fat intake in diet. Discouraged intake of processed foods and use of salt shaker. Encouraged fresh fruits and vegetables as well as whole grain sources of carbohydrates to maximize fiber intake. Teach back method used.  Expect poor compliance.  Body mass index is 25.83 kg/m. Pt meets criteria for overweight based on current BMI.  Current diet order is HH, patient is consuming approximately 100% of meals at this time. Labs and medications reviewed. No further nutrition interventions warranted at this time. RD contact information provided. If additional nutrition issues arise, please re-consult RD.  Betsey Holidayasey Vue Pavon MS, RD, LDN Pager #- [phone] Office#- [phone] After Hours Pager: [phone]

## 2018-08-30 NOTE — Progress Notes (Signed)
Cardiovascular and Pulmonary Nurse Navigator:    62 year old male with no past cardiac history who presented to the ED with chest pain, nausea and diaphoresis.  Patient ruled in for NSTEMI.  Patient underwent Cardiac Cath on 08/28/2018.  Results below.    Procedures   Aimee Montagna 08/28/2018 Coronary Thrombectomy  CORONARY STENT INTERVENTION  LEFT HEART CATH AND CORONARY ANGIOGRAPHY  Conclusion     A drug-eluting stent was successfully placed using a STENT SIERRA 3.00 X 15 MM.   Conclusions: 1. Multivessel coronary artery disease with severe lesions involving the mid LCx (culprit lesion), proximal ramus, ostial/proximal diagonal branch, and rPDA. 2. Moderate disease involving the proximal/mid RCA and proximal LAD. 3. Mildly reduced left ventricular systolic function with mid and apical inferior hypokinesis.  LVEF 45-50%. 4. Moderately elevated left ventricular filling pressure (LVEDP 25-30 mmHg). 5. Successful PCI to mid LCx using aspiration thrombectomy and Xience Sierra 3.0 x 15 mm drug-eluting stent (post-dilated to 3.4 mm).  There was embolization of thrombus into the mid portion of OM3 that was not intervened upon.  Recommendations: 1. Tirofiban infusion x 24.  Consider relook cath +/- staged PCI to ramus lesion.  I favor medical therapy of diagonal and rPDA, given vessel size.  Diagonal lesion is also a complex bifurcation lesion that could jeopardize the LAD during intervention. 2. Dual antiplatelet therapy with aspirin and ticagrelor for at least 12 months. 3. Aggressive secondary prevention. 4. Start low-dose beta blocker for ischemic cardiomyopathy.  Addition of ACEI/ARB should be considered before discharge, as blood pressure allows. 5. Obtain transthoracic echocardiogram.  Yvonne Kendall, MD [facility] Pager: [phone]     EDUCATION:   "Heart Attack Bouncing Back" booklet given and reviewed with patient. Discussed the definition of CAD. Reviewed the  location of CAD and where his stent was placed. Informed patient he will be given a stent card. Explained the purpose of the stent card. Instructed patient to keep stent card in his wallet.  ? Discussed modifiable risk factors including controlling blood pressure, cholesterol, and blood sugar; following heart healthy diet; maintaining healthy weight; exercise; and smoking cessation, if applicable ? Discussed cardiac medications including rationale for taking, mechanisms of action, and side effects. Stressed the importance of taking medications as prescribed. RNCM assisting with helping patient obtain his medications.  Patient has no health insurance.   ? Discussed emergency plan for heart attack symptoms. Patient verbalized understanding of need to call 911 and not to drive himself to ER if having cardiac symptoms / chest pain.  ? Heart healthy diet of low sodium, low fat, low cholesterol heart healthy diet discussed. Information on diet provided. Note: Referral placed yesterday for Dietitian Consultation for heart healthy diet education.   ? Smoking Cessation - Patient is a NEVER smoker.   ? Exercise - Benefits of exercised discussed. Informed patient that his cardiologist has referred him to outpatient Cardiac Rehab. An overview of the program was provided.  Patient has no health insurance and is unable to pay out-of-pocket for Cardiac Rehab.  Patient declined Cardiac Rehab.  Patient informed this RN that he stays active, as he has a lot of energy.  Patient does odd jobs for income.  Patient stated that he loves to walk and ride his bike.  Discussed the AHA guidelines for exercise.  Patient plans on walking and or riding his bike for exercise.   ? Patient appreciative of the information.  ? Army Melia, RN, BSN, [facility]  [place]  [place]  Cardiac & Pulmonary Rehab  Cardiovascular & Pulmonary Nurse Navigator  Direct Line: [phone]  Department Phone #: [phone] Fax: [phone]  Email  Address: [email or web address]

## 2018-09-02 ENCOUNTER — Telehealth: Payer: Self-pay | Admitting: *Deleted

## 2018-09-02 NOTE — Telephone Encounter (Signed)
Patient scheduled 09/12/18 with Ward Givenshris Berge, NP.

## 2018-09-02 NOTE — Telephone Encounter (Signed)
[facility].  Patient understands to follow up with provider Ward Givenshris Berge, NP on 09/12/18 at 3:00 pm [facility]. Patient understands discharge instructions? Yes Patient understands medications and regiment? Yes Patient understands to bring all medications to this visit? Yes  The patient states he has all of his medications except for Brlinita.  I have advised him of Dr. Jari SportsmanArida's note that was received to triage: ----- Message from Iran OuchMuhammad A Arida, MD sent at 08/30/2018  9:19 AM EST ----- This patient is being discharged home later today after non-STEMI and stenting done by by Dr. Okey DupreEnd.  TCM follow-up needed in 1 to 2 weeks. The patient does not have any health insurance and we arranged for him to get his medications through medication management.  He will get Brilinta for 1 month free with a coupon and then we should consider switching him to generic Plavix.   I have advised him to go to [facility]ication Management to pick up his Brilinta and take the [address] free co-pay card with him. He confirms he has this in his possession.  I have given him the physical address for [facility]ication Management and he voices understanding of the location and the above recommendations that were given.   I have asked that he call back with any further questions/ concerns, but that he will need to get the Brilinta TODAY.  He again voices understanding and is agreeable.

## 2018-09-02 NOTE — Telephone Encounter (Signed)
-----   Message from Muhammad A Arida, MD sent at 08/30/2018  9:19 AM EST ----- °This patient is being discharged home later today after non-STEMI and stenting done by by Dr. End.  °TCM follow-up needed in 1 to 2 weeks. °The patient does not have any health insurance and we arranged for him to get his medications through medication management.  He will get Brilinta for 1 month free with a coupon and then we should consider switching him to generic Plavix.  ° °

## 2018-09-03 ENCOUNTER — Ambulatory Visit: Payer: Self-pay

## 2018-09-12 ENCOUNTER — Encounter: Payer: Self-pay | Admitting: Nurse Practitioner

## 2018-09-12 ENCOUNTER — Ambulatory Visit (INDEPENDENT_AMBULATORY_CARE_PROVIDER_SITE_OTHER): Payer: Self-pay | Admitting: Nurse Practitioner

## 2018-09-12 ENCOUNTER — Encounter: Payer: Self-pay | Admitting: Gerontology

## 2018-09-12 ENCOUNTER — Other Ambulatory Visit: Payer: Self-pay

## 2018-09-12 ENCOUNTER — Ambulatory Visit: Payer: Self-pay | Admitting: Gerontology

## 2018-09-12 VITALS — BP 135/84 | HR 73 | Ht 70.0 in | Wt 188.2 lb

## 2018-09-12 VITALS — BP 108/68 | HR 76 | Ht 70.0 in | Wt 187.8 lb

## 2018-09-12 DIAGNOSIS — I2510 Atherosclerotic heart disease of native coronary artery without angina pectoris: Secondary | ICD-10-CM

## 2018-09-12 DIAGNOSIS — E782 Mixed hyperlipidemia: Secondary | ICD-10-CM

## 2018-09-12 DIAGNOSIS — Z7689 Persons encountering health services in other specified circumstances: Secondary | ICD-10-CM

## 2018-09-12 DIAGNOSIS — I214 Non-ST elevation (NSTEMI) myocardial infarction: Secondary | ICD-10-CM

## 2018-09-12 DIAGNOSIS — I255 Ischemic cardiomyopathy: Secondary | ICD-10-CM

## 2018-09-12 DIAGNOSIS — I10 Essential (primary) hypertension: Secondary | ICD-10-CM

## 2018-09-12 MED ORDER — LISINOPRIL 5 MG PO TABS: 5.0000 mg | ORAL_TABLET | Freq: Every day | ORAL | 3 refills | Status: DC

## 2018-09-12 MED ORDER — LISINOPRIL 5 MG PO TABS: 5.0000 mg | ORAL_TABLET | Freq: Every day | ORAL | 1 refills | Status: DC

## 2018-09-12 MED ORDER — LISINOPRIL 5 MG PO TABS: 5.0000 mg | ORAL_TABLET | Freq: Every day | ORAL | 11 refills | Status: DC

## 2018-09-12 MED ORDER — CLOPIDOGREL BISULFATE 75 MG PO TABS: 75.0000 mg | ORAL_TABLET | Freq: Every day | ORAL | 11 refills | Status: DC

## 2018-09-12 MED ORDER — METOPROLOL TARTRATE 25 MG PO TABS: 12.5000 mg | ORAL_TABLET | Freq: Two times a day (BID) | ORAL | 3 refills | Status: DC

## 2018-09-12 MED ORDER — NITROGLYCERIN 0.4 MG SL SUBL: 0.4000 mg | SUBLINGUAL_TABLET | SUBLINGUAL | 2 refills | Status: DC | PRN

## 2018-09-12 MED ORDER — ASPIRIN 81 MG PO TBEC: 81.0000 mg | DELAYED_RELEASE_TABLET | Freq: Every day | ORAL | 2 refills | Status: DC

## 2018-09-12 MED ORDER — METOPROLOL TARTRATE 25 MG PO TABS: 12.5000 mg | ORAL_TABLET | Freq: Two times a day (BID) | ORAL | 1 refills | Status: DC

## 2018-09-12 MED ORDER — ASPIRIN 81 MG PO TBEC: 81.0000 mg | DELAYED_RELEASE_TABLET | Freq: Every day | ORAL | 4 refills | Status: DC

## 2018-09-12 MED ORDER — ATORVASTATIN CALCIUM 40 MG PO TABS: 40.0000 mg | ORAL_TABLET | Freq: Every day | ORAL | 2 refills | Status: DC

## 2018-09-12 MED ORDER — METOPROLOL TARTRATE 25 MG PO TABS: 12.5000 mg | ORAL_TABLET | Freq: Two times a day (BID) | ORAL | 5 refills | Status: DC

## 2018-09-12 MED ORDER — ATORVASTATIN CALCIUM 40 MG PO TABS: 40.0000 mg | ORAL_TABLET | Freq: Every day | ORAL | 4 refills | Status: DC

## 2018-09-12 MED ORDER — ATORVASTATIN CALCIUM 40 MG PO TABS: 40.0000 mg | ORAL_TABLET | Freq: Every day | ORAL | 11 refills | Status: DC

## 2018-09-12 MED ORDER — NITROGLYCERIN 0.4 MG SL SUBL: 0.4000 mg | SUBLINGUAL_TABLET | SUBLINGUAL | 3 refills | Status: DC | PRN

## 2018-09-12 NOTE — Patient Instructions (Signed)
Fat and Cholesterol Restricted Eating Plan Getting too much fat and cholesterol in your diet may cause health problems. Choosing the right foods helps keep your fat and cholesterol at normal levels. This can keep you from getting certain diseases. Your doctor may recommend an eating plan that includes:  Total fat: ______% or less of total calories a Moise.  Saturated fat: ______% or less of total calories a Yadav.  Cholesterol: less than _________mg a Nierman.  Fiber: ______g a Korus. What are tips for following this plan? Meal planning  At meals, divide your plate into four equal parts: ? Fill one-half of your plate with vegetables and green salads. ? Fill one-fourth of your plate with whole grains. ? Fill one-fourth of your plate with low-fat (lean) protein foods.  Eat fish that is high in omega-3 fats at least two times a week. This includes mackerel, tuna, sardines, and salmon.  Eat foods that are high in fiber, such as whole grains, beans, apples, broccoli, carrots, peas, and barley. General tips   Work with your doctor to lose weight if you need to.  Avoid: ? Foods with added sugar. ? Fried foods. ? Foods with partially hydrogenated oils.  Limit alcohol intake to no more than 1 drink a [place] for nonpregnant women and 2 drinks a Glade for men. One drink equals 12 oz of beer, 5 oz of wine, or 1 oz of hard liquor. Reading food labels  Check food labels for: ? Trans fats. ? Partially hydrogenated oils. ? Saturated fat (g) in each serving. ? Cholesterol (mg) in each serving. ? Fiber (g) in each serving.  Choose foods with healthy fats, such as: ? Monounsaturated fats. ? Polyunsaturated fats. ? Omega-3 fats.  Choose grain products that have whole grains. Look for the word "whole" as the first word in the ingredient list. Cooking  Cook foods using low-fat methods. These include baking, boiling, grilling, and broiling.  Eat more home-cooked foods. Eat at restaurants and buffets  less often.  Avoid cooking using saturated fats, such as butter, cream, palm oil, palm kernel oil, and coconut oil. Recommended foods  Fruits  All fresh, canned (in natural juice), or frozen fruits. Vegetables  Fresh or frozen vegetables (raw, steamed, roasted, or grilled). Green salads. Grains  Whole grains, such as whole wheat or whole grain breads, crackers, cereals, and pasta. Unsweetened oatmeal, bulgur, barley, quinoa, or brown rice. Corn or whole wheat flour tortillas. Meats and other protein foods  Ground beef (85% or leaner), grass-fed beef, or beef trimmed of fat. Skinless chicken or turkey. Ground chicken or turkey. Pork trimmed of fat. All fish and seafood. Egg whites. Dried beans, peas, or lentils. Unsalted nuts or seeds. Unsalted canned beans. Nut butters without added sugar or oil. Dairy  Low-fat or nonfat dairy products, such as skim or 1% milk, 2% or reduced-fat cheeses, low-fat and fat-free ricotta or cottage cheese, or plain low-fat and nonfat yogurt. Fats and oils  Tub margarine without trans fats. Light or reduced-fat mayonnaise and salad dressings. Avocado. Olive, canola, sesame, or safflower oils. The items listed above may not be a complete list of foods and beverages you can eat. Contact a dietitian for more information. Foods to avoid Fruits  Canned fruit in heavy syrup. Fruit in cream or butter sauce. Fried fruit. Vegetables  Vegetables cooked in cheese, cream, or butter sauce. Fried vegetables. Grains  White bread. White pasta. White rice. Cornbread. Bagels, pastries, and croissants. Crackers and snack foods that contain trans fat   and hydrogenated oils. Meats and other protein foods  Fatty cuts of meat. Ribs, chicken wings, bacon, sausage, bologna, salami, chitterlings, fatback, hot dogs, bratwurst, and packaged lunch meats. Liver and organ meats. Whole eggs and egg yolks. Chicken and turkey with skin. Fried meat. Dairy  Whole or 2% milk, cream,  half-and-half, and cream cheese. Whole milk cheeses. Whole-fat or sweetened yogurt. Full-fat cheeses. Nondairy creamers and whipped toppings. Processed cheese, cheese spreads, and cheese curds. Beverages  Alcohol. Sugar-sweetened drinks such as sodas, lemonade, and fruit drinks. Fats and oils  Butter, stick margarine, lard, shortening, ghee, or bacon fat. Coconut, palm kernel, and palm oils. Sweets and desserts  Corn syrup, sugars, honey, and molasses. Candy. Jam and jelly. Syrup. Sweetened cereals. Cookies, pies, cakes, donuts, muffins, and ice cream. The items listed above may not be a complete list of foods and beverages you should avoid. Contact a dietitian for more information. Summary  Choosing the right foods helps keep your fat and cholesterol at normal levels. This can keep you from getting certain diseases.  At meals, fill one-half of your plate with vegetables and green salads.  Eat high-fiber foods, like whole grains, beans, apples, carrots, peas, and barley.  Limit added sugar, saturated fats, alcohol, and fried foods. This information is not intended to replace advice given to you by your health care provider. Make sure you discuss any questions you have with your health care provider. Document Released: 03/05/2012 Document Revised: 05/08/2018 Document Reviewed: 05/22/2017 Elsevier Interactive Patient Education  2019 [employer] Inc[employer]   DASH Eating Plan DASH stands for "Dietary Approaches to Stop Hypertension." The DASH eating plan is a healthy eating plan that has been shown to reduce high blood pressure (hypertension). It may also reduce your risk for type 2 diabetes, heart disease, and stroke. The DASH eating plan may also help with weight loss. What are tips for following this plan?  General guidelines  Avoid eating more than 2,300 mg (milligrams) of salt (sodium) a P[place]olski. If you have hypertension, you may need to reduce your sodium intake to 1,500 mg a Reichardt.  Limit  alcohol intake to no more than 1 drink a [place] for nonpregnant women and 2 drinks a T[place]ry for men. One drink equals 12 oz of beer, 5 oz of wine, or 1 oz of hard liquor.  Work with your health care provider to maintain a healthy body weight or to lose weight. Ask what an ideal weight is for you.  Get at least 30 minutes of exercise that causes your heart to beat faster (aerobic exercise) most days of the week. Activities may include walking, swimming, or biking.  Work with your health care provider or diet and nutrition specialist (dietitian) to adjust your eating plan to your individual calorie needs. Reading food labels   Check food labels for the amount of sodium per serving. Choose foods with less than 5 percent of the Daily Value of sodium. Generally, foods with less than 300 mg of sodium per serving fit into this eating plan.  To find whole grains, look for the word "whole" as the first word in the ingredient list. Shopping  Buy products labeled as "low-sodium" or "no salt added."  Buy fresh foods. Avoid canned foods and premade or frozen meals. Cooking  Avoid adding salt when cooking. Use salt-free seasonings or herbs instead of table salt or sea salt. Check with your health care provider or pharmacist before using salt substitutes.  Do not fry foods. Cook foods using healthy   methods such as baking, boiling, grilling, and broiling instead.  Cook with heart-healthy oils, such as olive, canola, soybean, or sunflower oil. Meal planning  Eat a balanced diet that includes: ? 5 or more servings of fruits and vegetables each Leppla. At each meal, try to fill half of your plate with fruits and vegetables. ? Up to 6-8 servings of whole grains each Goynes. ? Less than 6 oz of lean meat, poultry, or fish each B[place]up. A 3-oz serving of meat is about the same size as a deck of cards. One egg equals 1 oz. ? 2 servings of low-fat dairy each K[place]. ? A serving of nuts, seeds, or beans 5 times each  week. ? Heart-healthy fats. Healthy fats called Omega-3 fatty acids are found in foods such as flaxseeds and coldwater fish, like sardines, salmon, and mackerel.  Limit how much you eat of the following: ? Canned or prepackaged foods. ? Food that is high in trans fat, such as fried foods. ? Food that is high in saturated fat, such as fatty meat. ? Sweets, desserts, sugary drinks, and other foods with added sugar. ? Full-fat dairy products.  Do not salt foods before eating.  Try to eat at least 2 vegetarian meals each week.  Eat more home-cooked food and less restaurant, buffet, and fast food.  When eating at a restaurant, ask that your food be prepared with less salt or no salt, if possible. What foods are recommended? The items listed may not be a complete list. Talk with your dietitian about what dietary choices are best for you. Grains Whole-grain or whole-wheat bread. Whole-grain or whole-wheat pasta. Brown rice. Oatmeal. Quinoa. Bulgur. Whole-grain and low-sodium cereals. Pita bread. Low-fat, low-sodium crackers. Whole-wheat flour tortillas. Vegetables Fresh or frozen vegetables (raw, steamed, roasted, or grilled). Low-sodium or reduced-sodium tomato and vegetable juice. Low-sodium or reduced-sodium tomato sauce and tomato paste. Low-sodium or reduced-sodium canned vegetables. Fruits All fresh, dried, or frozen fruit. Canned fruit in natural juice (without added sugar). Meat and other protein foods Skinless chicken or turkey. Ground chicken or turkey. Pork with fat trimmed off. Fish and seafood. Egg whites. Dried beans, peas, or lentils. Unsalted nuts, nut butters, and seeds. Unsalted canned beans. Lean cuts of beef with fat trimmed off. Low-sodium, lean deli meat. Dairy Low-fat (1%) or fat-free (skim) milk. Fat-free, low-fat, or reduced-fat cheeses. Nonfat, low-sodium ricotta or cottage cheese. Low-fat or nonfat yogurt. Low-fat, low-sodium cheese. Fats and oils Soft margarine  without trans fats. Vegetable oil. Low-fat, reduced-fat, or light mayonnaise and salad dressings (reduced-sodium). Canola, safflower, olive, soybean, and sunflower oils. Avocado. Seasoning and other foods Herbs. Spices. Seasoning mixes without salt. Unsalted popcorn and pretzels. Fat-free sweets. What foods are not recommended? The items listed may not be a complete list. Talk with your dietitian about what dietary choices are best for you. Grains Baked goods made with fat, such as croissants, muffins, or some breads. Dry pasta or rice meal packs. Vegetables Creamed or fried vegetables. Vegetables in a cheese sauce. Regular canned vegetables (not low-sodium or reduced-sodium). Regular canned tomato sauce and paste (not low-sodium or reduced-sodium). Regular tomato and vegetable juice (not low-sodium or reduced-sodium). Pickles. Olives. Fruits Canned fruit in a light or heavy syrup. Fried fruit. Fruit in cream or butter sauce. Meat and other protein foods Fatty cuts of meat. Ribs. Fried meat. Bacon. Sausage. Bologna and other processed lunch meats. Salami. Fatback. Hotdogs. Bratwurst. Salted nuts and seeds. Canned beans with added salt. Canned or smoked fish. Whole eggs   or egg yolks. Chicken or turkey with skin. Dairy Whole or 2% milk, cream, and half-and-half. Whole or full-fat cream cheese. Whole-fat or sweetened yogurt. Full-fat cheese. Nondairy creamers. Whipped toppings. Processed cheese and cheese spreads. Fats and oils Butter. Stick margarine. Lard. Shortening. Ghee. Bacon fat. Tropical oils, such as coconut, palm kernel, or palm oil. Seasoning and other foods Salted popcorn and pretzels. Onion salt, garlic salt, seasoned salt, table salt, and sea salt. Worcestershire sauce. Tartar sauce. Barbecue sauce. Teriyaki sauce. Soy sauce, including reduced-sodium. Steak sauce. Canned and packaged gravies. Fish sauce. Oyster sauce. Cocktail sauce. Horseradish that you find on the shelf. Ketchup.  Mustard. Meat flavorings and tenderizers. Bouillon cubes. Hot sauce and Tabasco sauce. Premade or packaged marinades. Premade or packaged taco seasonings. Relishes. Regular salad dressings. Where to find more information:  National Heart, Lung, and Blood Institute: www.nhlbi.nih.gov  American Heart Association: www.heart.org Summary  The DASH eating plan is a healthy eating plan that has been shown to reduce high blood pressure (hypertension). It may also reduce your risk for type 2 diabetes, heart disease, and stroke.  With the DASH eating plan, you should limit salt (sodium) intake to 2,300 mg a Bowen. If you have hypertension, you may need to reduce your sodium intake to 1,500 mg a N[place]a.  When on the DASH eating plan, aim to eat more fresh fruits and vegetables, whole grains, lean proteins, low-fat dairy, and heart-healthy fats.  Work with your health care provider or diet and nutrition specialist (dietitian) to adjust your eating plan to your individual calorie needs. This information is not intended to replace advice given to you by your health care provider. Make sure you discuss any questions you have with your health care provider. Document Released: 08/24/2011 Document Revised: 08/28/2016 Document Reviewed: 08/28/2016 Elsevier Interactive Patient Education  2019 Elsevier Inc.   

## 2018-09-12 NOTE — Progress Notes (Signed)
Patient: Todd Reynolds Male    DOB: 1956-06-19   62 y.o.   MRN: [id number] Visit Date: 09/12/2018  Today's Provider: Langston Reusing, NP   Chief Complaint  Patient presents with  . New Patient (Initial Visit)    establish care   Subjective:    HPI Todd Reynolds is a 62 y/o male that presents for initial office visit and follow up after hospital discharge on 08/30/18 for NSTEMI. During hospital course, he had cardiac cath and PCI with 1 stent placed. He reports taking Plavix 75 mg and Aspirin 81 mg daily. He follows up with Charm Barges NP on 12/26 at 3 pm .  He denies chest pain, palpitation,shortness of breath. He reports that he has being exercising regularly. He has a history of hypertension, hyperlipidemia. He reports taking Lisinopril 5 mg and Metoprolol 25 mg daily and denies headache and dizziness. He admits taking Atorvastatin 40 mg for hyperlipidemia and denies myopathy. He denies fever, chills, hematuria, hematochezia and otherwise reports being in good health.       No Known Allergies Previous Medications   ASPIRIN EC 81 MG EC TABLET    Take 1 tablet (81 mg total) by mouth daily.   ATORVASTATIN (LIPITOR) 40 MG TABLET    Take 1 tablet (40 mg total) by mouth daily at 6 PM.   CLOPIDOGREL (PLAVIX) 75 MG TABLET    Take 1 tablet (75 mg total) by mouth daily.   LISINOPRIL (PRINIVIL,ZESTRIL) 5 MG TABLET    Take 1 tablet (5 mg total) by mouth daily.   METOPROLOL TARTRATE (LOPRESSOR) 25 MG TABLET    Take 0.5 tablets (12.5 mg total) by mouth 2 (two) times daily.   NITROGLYCERIN (NITROSTAT) 0.4 MG SL TABLET    Place 1 tablet (0.4 mg total) under the tongue every 5 (five) minutes as needed for chest pain.    Review of Systems  Constitutional: Negative.   HENT: Negative.   Eyes: Negative.   Respiratory: Negative.   Cardiovascular: Negative.   Gastrointestinal: Negative.   Endocrine: Negative.   Genitourinary: Negative.   Musculoskeletal: Negative.   Skin: Negative.   Neurological:  Negative.   Hematological: Negative.   Psychiatric/Behavioral: Negative.     Social History   Tobacco Use  . Smoking status: Never Smoker  . Smokeless tobacco: Never Used  Substance Use Topics  . Alcohol use: Never    Frequency: Never   Objective:   There were no vitals taken for this visit.  Physical Exam Constitutional:      Appearance: Normal appearance.  HENT:     Head: Normocephalic and atraumatic.     Nose: Nose normal.  Eyes:     Extraocular Movements: Extraocular movements intact.     Pupils: Pupils are equal, round, and reactive to light.  Neck:     Musculoskeletal: Normal range of motion.  Cardiovascular:     Rate and Rhythm: Normal rate and regular rhythm.     Pulses: Normal pulses.     Heart sounds: Normal heart sounds.  Pulmonary:     Effort: Pulmonary effort is normal.     Breath sounds: Normal breath sounds.  Abdominal:     General: Abdomen is flat.     Palpations: Abdomen is soft.  Musculoskeletal: Normal range of motion.  Skin:    General: Skin is warm and dry.  Neurological:     General: No focal deficit present.     Mental Status: He is alert and oriented to person, place,  and time.  Psychiatric:        Mood and Affect: Mood normal.        Behavior: Behavior normal.        Thought Content: Thought content normal.        Judgment: Judgment normal.         Assessment & Plan:     1. NSTEMI (non-ST elevated myocardial infarction) ([place]) -He will continue on - aspirin 81 MG EC tablet; Take 1 tablet (81 mg total) by mouth daily.  Dispense: 30 tablet; Refill: 4 - clopidogrel (PLAVIX) 75 MG tablet; Take 1 tablet (75 mg total) by mouth daily.  Dispense: 30 tablet; Refill: 11 - nitroGLYCERIN (NITROSTAT) 0.4 MG SL tablet; Place 1 tablet (0.4 mg total) under the tongue every 5 (five) minutes as needed for chest pain.  Dispense: 30 tablet; Refill: 3 - He was educated on the side effects of medication.  2. Encounter to establish care - Stool card  provided for colon cancer screening - Appointment with Ms. Jene Every LCSW for help with community resources  3. Essential hypertension - He was advised to continue on low salt diet, exercises at least 30 minutes a [place], monitor BP at home. He soul continue on the following medications, and was educated on the side effects of medications.  - lisinopril (PRINIVIL,ZESTRIL) 5 MG tablet; Take 1 tablet (5 mg total) by mouth daily.  Dispense: 30 tablet; Refill: 3 - metoprolol tartrate (LOPRESSOR) 25 MG tablet; Take 0.5 tablets (12.5 mg total) by mouth 2 (two) times daily.  Dispense: 60 tablet; Refill: 3 - CBC w/Diff; Future - Comp Met (CMET); Future - Urine Microalbumin w/creat. ratio; Future - HgB A1c; Future  4. Mixed hyperlipidemia - He was advised on eating low fat low cholesterol diet, continue to exercise at least 30 minutes daily. He will continue on 40 mg Lipitor and was educated on side effects. - atorvastatin (LIPITOR) 40 MG tablet; Take 1 tablet (40 mg total) by mouth daily at 6 PM.  Dispense: 30 tablet; Refill: 4 - Lipid Profile; Future - Follow up in 6 months with cbc/diff, cMet, lipid panel, U/A, and HgbA1c 1 week prior.       Langston Reusing, NP   [facility]

## 2018-09-12 NOTE — Progress Notes (Signed)
Office Visit    Patient Name: Todd Reynolds Date of Encounter: 09/12/2018  Primary Care Provider:  Patient, No Pcp Per Primary Cardiologist:  Yvonne Kendallhristopher End, MD  Chief Complaint    62 year old male with a recent history of non-STEMI and finding of multivessel CAD status post drug-eluting stent placement to left circumflex, hyperlipidemia, and ischemic cardiomyopathy, who presents for follow-up after recent hospitalization.  Past Medical History    Past Medical History:  Diagnosis Date  . CAD with hx of NSTEMI    a. 08/2018 NSTEMI/PCI: LM 20d, LAD 50p/d, D1 85ost, RI 90, LCX 16044m/d - thrombotic (3.0x15 [place] DES), OM1 small/nl, OM2 mod/nl, OM3 large - fills via dLAD collats, RCA 40p/m, RPDA1 60, RPDA2 75, RPAV mod/nl. EF 45-50%.  . Hyperlipidemia LDL goal <70   . Ischemic cardiomyopathy    a. 08/2018 LV gram: EF 45-50%; b. 08/2018 Echo: EF 50-55%, mild inflat HK. Mild MR, mildly dil LA/RA.   Past Surgical History:  Procedure Laterality Date  . CORONARY STENT INTERVENTION N/A 08/28/2018   Procedure: CORONARY STENT INTERVENTION;  Surgeon: Yvonne KendallEnd, Talyn Dessert, MD;  Location: [facility] INVASIVE CV LAB;  Service: Cardiovascular;  Laterality: N/A;  . CORONARY THROMBECTOMY N/A 08/28/2018   Procedure: Coronary Thrombectomy;  Surgeon: Yvonne KendallEnd, Alvester Eads, MD;  Location: [facility] INVASIVE CV LAB;  Service: Cardiovascular;  Laterality: N/A;  . LEFT HEART CATH AND CORONARY ANGIOGRAPHY N/A 08/28/2018   Procedure: LEFT HEART CATH AND CORONARY ANGIOGRAPHY;  Surgeon: Yvonne KendallEnd, Nimra Puccinelli, MD;  Location: [facility] INVASIVE CV LAB;  Service: Cardiovascular;  Laterality: N/A;    Allergies  No Known Allergies  History of Present Illness    62 year old male without a prior past medical history until he presented to the [facility] emergency department on December 11, with a 1 [place] history of rest and exertional substernal chest discomfort.  In the emergency department, his troponin was elevated at 5.24.  ECG  showed inferolateral ST and T changes.  He was admitted and our team was consulted.  Troponin eventually peaked at 9.95.  Diagnostic catheterization was performed and showed moderate to severe multivessel disease with a 100% thrombotic occlusion of the mid to distal left circumflex.  The circumflex was successfully intervened upon with placement of a drug-eluting stent.  EF was 45 to 50% by left ventriculography but 50 to 55% by echo.  He was placed on aspirin, Brilinta, beta-blocker, ACE inhibitor, and high potency statin therapy.  Films were reviewed related to residual disease and it was felt that these vessels were relatively small in nature-2 mm or less, and medical therapy should be continued unless he has refractory anginal symptoms.  He was discharged home on December 13.  Unfortunately, he was not able to use his 30-[place] free card for Brilinta and as result he was instead loaded with Plavix.  Since discharge, he has done well.  He has been tolerating medications well and has been walking around some without any recurrent chest pain or dyspnea.  He denies palpitations, PND, orthopnea, dizziness, syncope, edema, or early satiety.  He is not considering participation in cardiac rehabilitation secondary to lack of insurance.  Home Medications    Prior to Admission medications   Medication Sig Start Date End Date Taking? Authorizing Provider  aspirin 81 MG EC tablet Take 1 tablet (81 mg total) by mouth daily. 09/12/18  Yes Iloabachie, Chioma E, NP  atorvastatin (LIPITOR) 40 MG tablet Take 1 tablet (40 mg total) by mouth daily at 6 PM. 09/12/18  Yes Iloabachie,  Chioma E, NP  clopidogrel (PLAVIX) 75 MG tablet Take 1 tablet (75 mg total) by mouth daily. 09/12/18 09/12/19 Yes Iloabachie, Chioma E, NP  lisinopril (PRINIVIL,ZESTRIL) 5 MG tablet Take 1 tablet (5 mg total) by mouth daily. 09/12/18  Yes Iloabachie, Chioma E, NP  metoprolol tartrate (LOPRESSOR) 25 MG tablet Take 0.5 tablets (12.5 mg total) by  mouth 2 (two) times daily. 09/12/18  Yes Iloabachie, Chioma E, NP  nitroGLYCERIN (NITROSTAT) 0.4 MG SL tablet Place 1 tablet (0.4 mg total) under the tongue every 5 (five) minutes as needed for chest pain. 09/12/18  Yes Iloabachie, Chioma E, NP    Review of Systems    He has done well since discharge.  He denies chest pain, palpitations, dyspnea, pnd, orthopnea, n, v, dizziness, syncope, edema, weight gain, or early satiety.  All other systems reviewed and are otherwise negative except as noted above.  Physical Exam    VS:  BP 108/68   Pulse 76   Ht 5\' 10"  (1.778 m)   Wt 187 lb 12.8 oz (85.2 kg)   SpO2 95%   BMI 26.95 kg/m  , BMI Body mass index is 26.95 kg/m. GEN: Well nourished, well developed, in no acute distress. HEENT: normal. Neck: Supple, no JVD, carotid bruits, or masses. Cardiac: RRR, no murmurs, rubs, or gallops. No clubbing, cyanosis, edema.  Radials/DP/PT 2+ and equal bilaterally.  Right wrist catheterization site without bleeding, bruit, or hematoma.  Respiratory:  Respirations regular and unlabored, clear to auscultation bilaterally. GI: Soft, nontender, nondistended, BS + x 4. MS: no deformity or atrophy. Skin: warm and dry, no rash. Neuro:  Strength and sensation are intact. Psych: Normal affect.  Accessory Clinical Findings    ECG personally reviewed by me today -regular sinus rhythm, 76, leftward axis with inferolateral T wave inversion- no acute changes.  Assessment & Plan    1.  Non-STEMI, subsequent episode of care/coronary artery disease: Status post recent admission with chest pain and troponin elevation consistent with non-STEMI.  He is status post PCI and drug-eluting stent placement to the left circumflex.  He has residual disease involving his diagonal, ramus, and RPDA's.  Films were reviewed prior to his hospital discharge and was felt that these areas were small vessels and therefore medical therapy was warranted.  Fortunately, he has not had any  refractory angina.  He remains on aspirin, Plavix, statin, beta-blocker, and ACE inhibitor therapy.  He was not able to get Brilinta secondary to cost.  He does not have insurance and therefore will not be able to participate in cardiac rehabilitation.  He does plan to walk daily.  2.  Ischemic cardiomyopathy: EF 50 to 55% by echocardiography.  Euvolemic on examination.  Continue beta-blocker and ACE inhibitor.  Follow-up basic metabolic panel today in the setting of initiation of ACE inhibitor during hospitalization.  3.  Hyperlipidemia: LDL was 164 in December 12.  He is currently on Lipitor 40 mg daily.  We will plan to follow-up lipids and LFTs in approximately 1 month.  4.  Disposition: Follow-up in clinic in 1 month with lipids and LFTs at that time.   Nicolasa Duckinghristopher Delontae Lamm, NP 09/12/2018, 3:59 PM

## 2018-09-12 NOTE — Patient Instructions (Signed)
Medication Instructions:  Your physician recommends that you continue on your current medications as directed. Please refer to the Current Medication list given to you today.  If you need a refill on your cardiac medications before your next appointment, please call your pharmacy.   Lab work: Your physician recommends that you return for lab work today Designer, jewellery(BMET)  If you have labs (blood work) drawn today and your tests are completely normal, you will receive your results only by: Marland Kitchen. MyChart Message (if you have MyChart) OR . A paper copy in the mail If you have any lab test that is abnormal or we need to change your treatment, we will call you to review the results.  Testing/Procedures: None ordered   Follow-Up: At [facility], you and your health needs are our priority.  As part of our continuing mission to provide you with exceptional heart care, we have created designated Provider Care Teams.  These Care Teams include your primary Cardiologist (physician) and Advanced Practice Providers (APPs -  Physician Assistants and Nurse Practitioners) who all work together to provide you with the care you need, when you need it. You will need a follow up appointment in 1 months. You may see Dr. Okey DupreEnd  or one of the following Advanced Practice Providers on your designated Care Team:   Nicolasa Duckinghristopher Berge, NP Eula Listenyan Dunn, PA-C . Marisue IvanJacquelyn Visser, PA-C

## 2018-09-13 ENCOUNTER — Telehealth: Payer: Self-pay

## 2018-09-13 LAB — BASIC METABOLIC PANEL
BUN/Creatinine Ratio: 10 (ref 10–24)
BUN: 11 mg/dL (ref 8–27)
CO2: 24 mmol/L (ref 20–29)
Calcium: 9.9 mg/dL (ref 8.6–10.2)
Chloride: 101 mmol/L (ref 96–106)
Creatinine, Ser: 1.11 mg/dL (ref 0.76–1.27)
GFR calc non Af Amer: 72 mL/min/{1.73_m2} (ref >59)
GFR, EST AFRICAN AMERICAN: 83 mL/min/{1.73_m2} (ref >59)
Glucose: 102 mg/dL — ABNORMAL HIGH (ref 65–99)
Potassium: 4.5 mmol/L (ref 3.5–5.2)
SODIUM: 141 mmol/L (ref 134–144)

## 2018-09-13 NOTE — Telephone Encounter (Signed)
-----   Message from Creig Hineshristopher Ronald Berge, NP sent at 09/13/2018  8:03 AM EST ----- Renal fxn/lytes ok.

## 2018-09-13 NOTE — Telephone Encounter (Signed)
Call to patient to discuss lab results. Pt verbalized understanding, no further questions at this time. Will continue with POC.  Advised pt to call for any further questions or concerns

## 2018-09-19 ENCOUNTER — Ambulatory Visit: Payer: Self-pay | Admitting: Licensed Clinical Social Worker

## 2018-09-19 DIAGNOSIS — Z598 Other problems related to housing and economic circumstances: Secondary | ICD-10-CM

## 2018-09-19 DIAGNOSIS — Z599 Problem related to housing and economic circumstances, unspecified: Secondary | ICD-10-CM

## 2018-09-24 NOTE — Progress Notes (Signed)
Clinician assisted the client with resources by helping him fill out an application for charity care and an application for food stamps. Clinician had the client fill out a consent form for him to get assistance through [employer] for help with his utility bill.

## 2018-10-02 ENCOUNTER — Telehealth: Payer: Self-pay | Admitting: Pharmacy Technician

## 2018-10-02 NOTE — Telephone Encounter (Signed)
Received updated proof of income.  Patient eligible to receive medication assistance at [facility], as long as eligibility requirements continue to be met.  Betty J. Kluttz Care Manager [facility] 

## 2018-10-15 ENCOUNTER — Ambulatory Visit (INDEPENDENT_AMBULATORY_CARE_PROVIDER_SITE_OTHER): Payer: Self-pay | Admitting: Nurse Practitioner

## 2018-10-15 ENCOUNTER — Encounter: Payer: Self-pay | Admitting: Nurse Practitioner

## 2018-10-15 VITALS — BP 134/80 | HR 72 | Ht 72.0 in | Wt 187.0 lb

## 2018-10-15 DIAGNOSIS — E785 Hyperlipidemia, unspecified: Secondary | ICD-10-CM

## 2018-10-15 DIAGNOSIS — I255 Ischemic cardiomyopathy: Secondary | ICD-10-CM

## 2018-10-15 DIAGNOSIS — I2510 Atherosclerotic heart disease of native coronary artery without angina pectoris: Secondary | ICD-10-CM

## 2018-10-15 MED ORDER — LISINOPRIL 10 MG PO TABS: 10.0000 mg | ORAL_TABLET | Freq: Every day | ORAL | 3 refills | Status: DC

## 2018-10-15 NOTE — Progress Notes (Signed)
Office Visit    Patient Name: Todd Reynolds Date of Encounter: 10/15/2018  Primary Care Provider:  Patient, No Pcp Per Primary Cardiologist:  Yvonne Kendall, MD  Chief Complaint    63 year old male with a recent history of non-STEMI and finding multivessel CAD status post drug-eluting stent placement to the left circumflex, hyperlipidemia, and ischemic cardiomyopathy, who presents for follow-up related to CAD.  Past Medical History    Past Medical History:  Diagnosis Date  . CAD with hx of NSTEMI    a. 08/2018 NSTEMI/PCI: LM 20d, LAD 50p/d, D1 85ost, RI 90, LCX 125m/d - thrombotic (3.0x15 [place] DES), OM1 small/nl, OM2 mod/nl, OM3 large - fills via dLAD collats, RCA 40p/m, RPDA1 60, RPDA2 75, RPAV mod/nl. EF 45-50%.  . Hyperlipidemia LDL goal <70   . Ischemic cardiomyopathy    a. 08/2018 LV gram: EF 45-50%; b. 08/2018 Echo: EF 50-55%, mild inflat HK. Mild MR, mildly dil LA/RA.   Past Surgical History:  Procedure Laterality Date  . CORONARY STENT INTERVENTION N/A 08/28/2018   Procedure: CORONARY STENT INTERVENTION;  Surgeon: Yvonne Kendall, MD;  Location: [facility];  Service: Cardiovascular;  Laterality: N/A;  . CORONARY THROMBECTOMY N/A 08/28/2018   Procedure: Coronary Thrombectomy;  Surgeon: Yvonne Kendall, MD;  Location: [facility];  Service: Cardiovascular;  Laterality: N/A;  . LEFT HEART CATH AND CORONARY ANGIOGRAPHY N/A 08/28/2018   Procedure: LEFT HEART CATH AND CORONARY ANGIOGRAPHY;  Surgeon: Yvonne Kendall, MD;  Location: [facility] IN[facility] CV [facility];  Service: Cardiovascular;  Laterality: N/A;    Allergies  No Known Allergies  History of Present Illness    63 year old male with the above past medical history who was admitted to [facility] on August 28, 2018 with chest pain and non-STEMI.  Catheterization showed moderate to severe multivessel disease with a 100% thrombotic occlusion of the mid to distal left circumflex.  The circumflex was  successfully intervened upon with placement of a drug-eluting stent.  EF was 45 to 50% by left radiography but 50 to 55% by echo.  He was noted to have residual disease involving the diagonal, ramus, and distal right coronary artery.  These were small, less than 2 mm vessels, and medical therapy was recommended.  I saw him back in clinic on December 26, at which time he was doing well.  Due to lack of insurance, he was not planning on participating in cardiac rehab.  Since his last visit, he has done well.  He denies chest pain, dyspnea, palpitations, PND, orthopnea, dizziness, syncope, edema, or early satiety.  He has been compliant with his medications and walking regularly.  Home Medications    Prior to Admission medications   Medication Sig Start Date End Date Taking? Authorizing Provider  aspirin 81 MG EC tablet Take 1 tablet (81 mg total) by mouth daily. 09/12/18  Yes Iloabachie, Chioma E, NP  atorvastatin (LIPITOR) 40 MG tablet Take 1 tablet (40 mg total) by mouth daily at 6 PM. 09/12/18  Yes Creig Hines, NP  clopidogrel (PLAVIX) 75 MG tablet Take 1 tablet (75 mg total) by mouth daily. 09/12/18 09/12/19 Yes Creig Hines, NP  lisinopril (PRINIVIL,ZESTRIL) 5 MG tablet Take 1 tablet (5 mg total) by mouth daily. 09/12/18  Yes Creig Hines, NP  metoprolol tartrate (LOPRESSOR) 25 MG tablet Take 0.5 tablets (12.5 mg total) by mouth 2 (two) times daily. 09/12/18  Yes Creig Hines, NP  nitroGLYCERIN (NITROSTAT) 0.4 MG SL tablet Place 1 tablet (  0.4 mg total) under the tongue every 5 (five) minutes as needed for chest pain. 09/12/18  Yes Iloabachie, Chioma E, NP    Review of Systems    He denies chest pain, palpitations, dyspnea, pnd, orthopnea, n, v, dizziness, syncope, edema, weight gain, or early satiety.  All other systems reviewed and are otherwise negative except as noted above.  Physical Exam    VS:  BP 134/80 (BP Location: Left Arm,  Patient Position: Sitting, Cuff Size: Normal)   Pulse 72   Ht 6' (1.829 m)   Wt 187 lb (84.8 kg)   BMI 25.36 kg/m  , BMI Body mass index is 25.36 kg/m. GEN: Well nourished, well developed, in no acute distress. HEENT: normal. Neck: Supple, no JVD, carotid bruits, or masses. Cardiac: RRR, no murmurs, rubs, or gallops. No clubbing, cyanosis, edema.  Radials/DP/PT 2+ and equal bilaterally.  Respiratory:  Respirations regular and unlabored, clear to auscultation bilaterally. GI: Soft, nontender, nondistended, BS + x 4. MS: no deformity or atrophy. Skin: warm and dry, no rash. Neuro:  Strength and sensation are intact. Psych: Normal affect.  Accessory Clinical Findings    ECG personally reviewed by me today -regular sinus rhythm, 72, leftward axis with lateral infarct and inferolateral T wave inversion- no acute changes.  Assessment & Plan    1.  Coronary artery disease: Status post non-STEMI in early December 2019 with PCI and drug-eluting stent placement to the left circumflex.  He has residual small vessel disease involving the diagonal, ramus, and distal RCA.  He has been medically managed with aspirin, Plavix, statin, beta-blocker, and ACE inhibitor therapy.  He has been walking regularly without recurrent symptoms or limitations.  He does not have insurance and therefore does not plan to participate in cardiac rehab.  2.  Ischemic cardiomyopathy: EF 50 to 55% by echocardiography.  Euvolemic on examination.  Continue beta-blocker and titrate ACE inhibitor in the setting of mild hypertension.  Follow-up basic metabolic panel next week.  3.  Hyperlipidemia: LDL 160 22 August 2011.  He is currently on Lipitor 40 mg daily.  I will follow-up lipids and LFTs next week when fasting.  4.  Elevated blood pressure: Mild elevation of blood pressure 134/80.  Titrating lisinopril as above.  Follow-up basic metabolic panel in 1 week.  5.  Disposition: Follow-up labs next week.  Follow-up in clinic  in 3 months or sooner if necessary.   Nicolasa Ducking, NP 10/15/2018, 4:58 PM

## 2018-10-15 NOTE — Patient Instructions (Signed)
Medication Instructions:  Your physician has recommended you make the following change in your medication:  1- INCREASE Lisinopril Take 1 tablet (10 mg total) by mouth daily  If you need a refill on your cardiac medications before your next appointment, please call your pharmacy.   Lab work: Your physician recommends that you return for lab work in: 1 week, please make an appt to return for blood work. (fasting lipid, CMET)  If you have labs (blood work) drawn today and your tests are completely normal, you will receive your results only by: Marland Kitchen. MyChart Message (if you have MyChart) OR . A paper copy in the mail If you have any lab test that is abnormal or we need to change your treatment, we will call you to review the results.  Testing/Procedures: None ordered   Follow-Up: At [facility], you and your health needs are our priority.  As part of our continuing mission to provide you with exceptional heart care, we have created designated Provider Care Teams.  These Care Teams include your primary Cardiologist (physician) and Advanced Practice Providers (APPs -  Physician Assistants and Nurse Practitioners) who all work together to provide you with the care you need, when you need it. You will need a follow up appointment in 3 months. You may see Yvonne Kendallhristopher End, MD or one of the following Advanced Practice Providers on your designated Care Team:   Nicolasa Duckinghristopher Berge, NP Eula Listenyan Dunn, PA-C . Marisue IvanJacquelyn Visser, PA-C

## 2018-10-22 ENCOUNTER — Other Ambulatory Visit (INDEPENDENT_AMBULATORY_CARE_PROVIDER_SITE_OTHER): Payer: Self-pay

## 2018-10-22 DIAGNOSIS — I2510 Atherosclerotic heart disease of native coronary artery without angina pectoris: Secondary | ICD-10-CM

## 2018-10-23 ENCOUNTER — Other Ambulatory Visit: Payer: Self-pay

## 2018-10-23 ENCOUNTER — Telehealth: Payer: Self-pay

## 2018-10-23 DIAGNOSIS — E782 Mixed hyperlipidemia: Secondary | ICD-10-CM

## 2018-10-23 LAB — SPECIMEN STATUS

## 2018-10-23 NOTE — Telephone Encounter (Signed)
[facility] wants to confirm specimen collection was correct and lavender tube was shaken off

## 2018-10-24 LAB — COMPREHENSIVE METABOLIC PANEL
A/G RATIO: 1.8 (ref 1.2–2.2)
ALT: 31 IU/L (ref 0–44)
AST: 18 IU/L (ref 0–40)
Albumin: 4.5 g/dL (ref 3.8–4.8)
Alkaline Phosphatase: 92 IU/L (ref 39–117)
BUN/Creatinine Ratio: 11 (ref 10–24)
BUN: 12 mg/dL (ref 8–27)
Bilirubin Total: 1.4 mg/dL — ABNORMAL HIGH (ref 0.0–1.2)
CO2: 23 mmol/L (ref 20–29)
Calcium: 9.4 mg/dL (ref 8.6–10.2)
Chloride: 104 mmol/L (ref 96–106)
Creatinine, Ser: 1.1 mg/dL (ref 0.76–1.27)
GFR calc Af Amer: 83 mL/min/{1.73_m2} (ref >59)
GFR calc non Af Amer: 72 mL/min/{1.73_m2} (ref >59)
Globulin, Total: 2.5 g/dL (ref 1.5–4.5)
Glucose: 100 mg/dL — ABNORMAL HIGH (ref 65–99)
Potassium: 4.8 mmol/L (ref 3.5–5.2)
SODIUM: 145 mmol/L — AB (ref 134–144)
Total Protein: 7 g/dL (ref 6.0–8.5)

## 2018-10-24 LAB — LIPID PANEL
Chol/HDL Ratio: 3.5 ratio (ref 0.0–5.0)
Cholesterol, Total: 120 mg/dL (ref 100–199)
HDL: 34 mg/dL — ABNORMAL LOW (ref >39)
LDL Calculated: 74 mg/dL (ref 0–99)
Triglycerides: 59 mg/dL (ref 0–149)
VLDL Cholesterol Cal: 12 mg/dL (ref 5–40)

## 2018-10-24 MED ORDER — ATORVASTATIN CALCIUM 80 MG PO TABS: 80.0000 mg | ORAL_TABLET | Freq: Every day | ORAL | 3 refills | Status: DC

## 2018-10-24 NOTE — Telephone Encounter (Signed)
-----   Message from Creig Hines, NP sent at 10/23/2018  4:25 PM EST ----- ldl mildly above goal @ 74.  Pls increase lipitor to 80mg .  Total bili mildly elevated.  Renal fxn and lytes ok.  F/u lipids/lft's in 6 wks.

## 2018-10-24 NOTE — Telephone Encounter (Signed)
Call to patient to review lab results and recommendations from provider.   Pt verbalized understanding and had no further questions at this time.   Rx updated and sent to pt preferred pharmacy.  Lab orders placed to have 'in clinic' per pt preference.   Call routed to scheduling for lab appt.

## 2018-10-24 NOTE — Telephone Encounter (Signed)
Spoke with crystal with [employer].  She stated that there was an extra Lavender tube sent in but no lab test with it.  Made her aware that the test we drew did not require a lavender tube.  She stated she would make them aware that the tube was not needed.

## 2018-10-24 NOTE — Telephone Encounter (Signed)
Tried trying [facility] at the ext left.  No answer, LMOV for them to call back.

## 2018-10-24 NOTE — Telephone Encounter (Signed)
Fasting labs due in march .  Staff schedule pending.  Will call to schedule when available.

## 2018-11-05 NOTE — Telephone Encounter (Signed)
lmov to schedule labs due 11/22/18 fasting lipid and liver

## 2018-12-06 ENCOUNTER — Telehealth: Payer: Self-pay | Admitting: Internal Medicine

## 2018-12-06 NOTE — Telephone Encounter (Signed)
I spoke with the patient. He is on the lab schedule for a repeat lipid/liver on Monday 3/23.  The patient is without complaints today. I have advised him that we are going to cancel his lab appointment and we will call back to r/s this no sooner than 4 weeks out.   The patient voices understanding and is agreeable.

## 2018-12-09 ENCOUNTER — Other Ambulatory Visit: Payer: Self-pay

## 2018-12-10 NOTE — Telephone Encounter (Signed)
Patient has appointment 3 month follow up on 01/15/19. He will need lab work either before if labs are being scheduled at that point otherwise he could possible have drawn that [place] if fasting.

## 2018-12-30 NOTE — Telephone Encounter (Signed)
Attempted to reach patient to switch to Virtual visit for upcoming appointment. Still ok to defer lab work at this time and still have appointment.

## 2019-01-01 ENCOUNTER — Telehealth: Payer: Self-pay | Admitting: *Deleted

## 2019-01-01 NOTE — Telephone Encounter (Signed)
Attempted to contact.  No ans no vm .  Will try again another time .

## 2019-01-01 NOTE — Telephone Encounter (Signed)
Virtual Visit Pre-Appointment Phone Call  Steps For Call:  1. Confirm consent - "In the setting of the current Covid19 crisis, you are scheduled for a (TELEPONE) visit with your provider on (01/06/19) at (10:00).  Just as we do with many in-office visits, in order for you to participate in this visit, we must obtain consent.  If you'd like, I can send this to your mychart (if signed up) or email for you to review.  Otherwise, I can obtain your verbal consent now.  All virtual visits are billed to your insurance company just like a normal visit would be.  By agreeing to a virtual visit, we'd like you to understand that the technology does not allow for your provider to perform an examination, and thus may limit your provider's ability to fully assess your condition.  Finally, though the technology is pretty good, we cannot assure that it will always work on either your or our end, and in the setting of a video visit, we may have to convert it to a phone-only visit.  In either situation, we cannot ensure that we have a secure connection.  Are you willing to proceed?"   YES 2. Confirm the BEST phone number to call the [place] of the visit by including in appointment notes  3. Give patient instructions for WebEx/MyChart download to smartphone as below or Doximity/Doxy.me if video visit (depending on what platform provider is using)  4. Advise patient to be prepared with their blood pressure, heart rate, weight, any heart rhythm information, their current medicines, and a piece of paper and pen handy for any instructions they may receive the [place] of their visit  5. Inform patient they will receive a phone call 15 minutes prior to their appointment time (may be from unknown caller ID) so they should be prepared to answer  6. Confirm that appointment type is correct in Epic appointment notes (VIDEO vs PHONE)     TELEPHONE CALL NOTE  Todd Reynolds has been deemed a candidate for a follow-up tele-health  visit to limit community exposure during the Covid-19 pandemic. I spoke with the patient via phone to ensure availability of phone/video source, confirm preferred email & phone number, and discuss instructions and expectations.  I reminded Todd Reynolds to be prepared with any vital sign and/or heart rhythm information that could potentially be obtained via home monitoring, at the time of his visit. I reminded Todd Reynolds to expect a phone call at the time of his visit if his visit.  Oluwakemi Salsberry C, CMA 01/01/2019 11:54 AM   INSTRUCTIONS FOR DOWNLOADING THE WEBEX APP TO SMARTPHONE  - If Apple, ask patient to go to [employer] and type in WebEx in the search bar. Download [employer], the blue/green circle. If Android, go to [employer] and type in [employer] in the search bar. The app is free but as with any other app downloads, their phone may require them to verify saved payment information or Apple/Android password.  - The patient does NOT have to create an account. - On the [place] of the visit, the assist will walk the patient through joining the meeting with the meeting number/password.  INSTRUCTIONS FOR DOWNLOADING THE MYCHART APP TO SMARTPHONE  - The patient must first make sure to have activated MyChart and know their login information - If Apple, go to [employer] and type in MyChart in the search bar and download the app. If Android, ask patient to go to  Google Play Store and type in [place] in the search bar and download the app. The app is free but as with any other app downloads, their phone may require them to verify saved payment information or Apple/Android password.  - The patient will need to then log into the app with their MyChart username and password, and select  as their healthcare provider to link the account. When it is time for your visit, go to the MyChart app, find appointments, and click Begin Video Visit. Be sure to Select Allow for your device to access  the Microphone and Camera for your visit. You will then be connected, and your provider will be with you shortly.  **If they have any issues connecting, or need assistance please contact MyChart service desk [phone])**  **If using a computer, in order to ensure the best quality for their visit they will need to use either of the following Internet Browsers: [employer], or Google Chrome**  IF USING DOXIMITY or DOXY.ME - The patient will receive a link just prior to their visit, either by text or email (to be determined [place] of appointment depending on if it's doxy.me or Doximity).     FULL LENGTH CONSENT FOR TELE-HEALTH VISIT   I hereby voluntarily request, consent and authorize [facility] and its employed or contracted physicians, physician assistants, nurse practitioners or other licensed health care professionals (the Practitioner), to provide me with telemedicine health care services (the Services") as deemed necessary by the treating Practitioner. I acknowledge and consent to receive the Services by the Practitioner via telemedicine. I understand that the telemedicine visit will involve communicating with the Practitioner through live audiovisual communication technology and the disclosure of certain medical information by electronic transmission. I acknowledge that I have been given the opportunity to request an in-person assessment or other available alternative prior to the telemedicine visit and am voluntarily participating in the telemedicine visit.  I understand that I have the right to withhold or withdraw my consent to the use of telemedicine in the course of my care at any time, without affecting my right to future care or treatment, and that the Practitioner or I may terminate the telemedicine visit at any time. I understand that I have the right to inspect all information obtained and/or recorded in the course of the telemedicine visit and may receive copies of  available information for a reasonable fee.  I understand that some of the potential risks of receiving the Services via telemedicine include:   Delay or interruption in medical evaluation due to technological equipment failure or disruption;  Information transmitted may not be sufficient (e.g. poor resolution of images) to allow for appropriate medical decision making by the Practitioner; and/or   In rare instances, security protocols could fail, causing a breach of personal health information.  Furthermore, I acknowledge that it is my responsibility to provide information about my medical history, conditions and care that is complete and accurate to the best of my ability. I acknowledge that Practitioner's advice, recommendations, and/or decision may be based on factors not within their control, such as incomplete or inaccurate data provided by me or distortions of diagnostic images or specimens that may result from electronic transmissions. I understand that the practice of medicine is not an exact science and that Practitioner makes no warranties or guarantees regarding treatment outcomes. I acknowledge that I will receive a copy of this consent concurrently upon execution via email to the email address I last provided but  may also request a printed copy by calling the office of [facility].    I understand that my insurance will be billed for this visit.   I have read or had this consent read to me.  I understand the contents of this consent, which adequately explains the benefits and risks of the Services being provided via telemedicine.   I have been provided ample opportunity to ask questions regarding this consent and the Services and have had my questions answered to my satisfaction.  I give my informed consent for the services to be provided through the use of telemedicine in my medical care  By participating in this telemedicine visit I agree to the above.

## 2019-01-02 NOTE — Telephone Encounter (Signed)
Marina updated appt status removing from wq

## 2019-01-04 NOTE — Progress Notes (Signed)
Virtual Visit via Telephone Note   This visit type was conducted due to national recommendations for restrictions regarding the COVID-19 Pandemic (e.g. social distancing) in an effort to limit this patient's exposure and mitigate transmission in our community.  Due to his co-morbid illnesses, this patient is at least at moderate risk for complications without adequate follow up.  This format is felt to be most appropriate for this patient at this time.  The patient did not have access to video technology/had technical difficulties with video requiring transitioning to audio format only (telephone).  All issues noted in this document were discussed and addressed.  No physical exam could be performed with this format.  Please refer to the patient's chart for his  consent to telehealth for [facility].   Evaluation Performed:  Follow-up visit  Date:  01/06/2019   ID:  Todd Reynolds, DOB May 02, 1956, MRN [id number]  Patient Location: Home Provider Location: Home  PCP:  Patient, No Pcp Per  Cardiologist:  Yvonne Kendall, MD  Electrophysiologist:  None   Chief Complaint: Follow-up coronary artery disease  History of Present Illness:    Todd Reynolds is a 63 y.o. male with history of CAD (NSTEMI in 08/2028 s/p PCI to LCx), ischemic cardiomyopathy, and hyperlipidemia.  We are speaking today for follow-up of CAD.  He was last seen in our office by Ward Givens, NP in late January.  At that time, he was doing well.  He was walking regularly but did not participate in cardiac rehab due to financial constraints.  Today, Todd Reynolds reports that he continues to feel well without chest pain, shortness of breath, palpitations, and edema.  He noted occasional lightheadedness when taking his occasions on an empty stomach, though resolved.  He continues to walk regularly and also bike without difficulty.  He is practicing social distancing.  The patient does not have symptoms concerning for COVID-19 infection  (fever, chills, cough, or new shortness of breath).    Past Medical History:  Diagnosis Date  . CAD with hx of NSTEMI    a. 08/2018 NSTEMI/PCI: LM 20d, LAD 50p/d, D1 85ost, RI 90, LCX 141m/d - thrombotic (3.0x15 [place] DES), OM1 small/nl, OM2 mod/nl, OM3 large - fills via dLAD collats, RCA 40p/m, RPDA1 60, RPDA2 75, RPAV mod/nl. EF 45-50%.  . Hyperlipidemia LDL goal <70   . Ischemic cardiomyopathy    a. 08/2018 LV gram: EF 45-50%; b. 08/2018 Echo: EF 50-55%, mild inflat HK. Mild MR, mildly dil LA/RA.   Past Surgical History:  Procedure Laterality Date  . CORONARY STENT INTERVENTION N/A 08/28/2018   Procedure: CORONARY STENT INTERVENTION;  Surgeon: Yvonne Kendall, MD;  Location: [facility] INVASIVE CV LAB;  Service: Cardiovascular;  Laterality: N/A;  . CORONARY THROMBECTOMY N/A 08/28/2018   Procedure: Coronary Thrombectomy;  Surgeon: Yvonne Kendall, MD;  Location: [facility] INVASIVE CV LAB;  Service: Cardiovascular;  Laterality: N/A;  . LEFT HEART CATH AND CORONARY ANGIOGRAPHY N/A 08/28/2018   Procedure: LEFT HEART CATH AND CORONARY ANGIOGRAPHY;  Surgeon: Yvonne Kendall, MD;  Location: [facility] INVASIVE CV LAB;  Service: Cardiovascular;  Laterality: N/A;     Current Meds  Medication Sig  . aspirin 81 MG EC tablet Take 1 tablet (81 mg total) by mouth daily.  Marland Kitchen atorvastatin (LIPITOR) 80 MG tablet Take 1 tablet (80 mg total) by mouth daily at 6 PM.  . clopidogrel (PLAVIX) 75 MG tablet Take 1 tablet (75 mg total) by mouth daily.  Marland Kitchen lisinopril (PRINIVIL,ZESTRIL) 10 MG tablet Take 1 tablet (  10 mg total) by mouth daily.  . metoprolol tartrate (LOPRESSOR) 25 MG tablet Take 0.5 tablets (12.5 mg total) by mouth 2 (two) times daily.  . nitroGLYCERIN (NITROSTAT) 0.4 MG SL tablet Place 1 tablet (0.4 mg total) under the tongue every 5 (five) minutes as needed for chest pain.     Allergies:   Patient has no known allergies.   Social History   Tobacco Use  . Smoking status: Never Smoker  . Smokeless  tobacco: Never Used  Substance Use Topics  . Alcohol use: Never    Frequency: Never  . Drug use: Never     Family Hx: The patient's family history includes Diabetes in his sister; Heart attack in his sister; Heart disease in his brother; Hypertension in his sister.  ROS:   Please see the history of present illness.   All other systems reviewed and are negative.   Prior CV studies:   The following studies were reviewed today:  Echo (08/29/2018): Normal LV size with LVEF 50-55% and mild hypokinesis of the inferolateral myocardial.  Mild MR and mild biatrial enlargement.  LHC/PCI (08/28/2018): 1. Multivessel coronary artery disease with severe lesions involving the mid LCx (culprit lesion), proximal ramus, ostial/proximal diagonal branch, and rPDA. 2. Moderate disease involving the proximal/mid RCA and proximal LAD. 3. Mildly reduced left ventricular systolic function with mid and apical inferior hypokinesis.  LVEF 45-50%. 4. Moderately elevated left ventricular filling pressure (LVEDP 25-30 mmHg). 5. Successful PCI to mid LCx using aspiration thrombectomy and Xience Sierra 3.0 x 15 mm drug-eluting stent (post-dilated to 3.4 mm).  There was embolization of thrombus into the mid portion of OM3 that was not intervened upon.  Labs/Other Tests and Data Reviewed:    EKG:  No ECG reviewed.  Recent Labs: 08/29/2018: Magnesium 2.2 10/22/2018: ALT 31; BUN 12; Creatinine, Ser 1.10; Hemoglobin WILL FOLLOW; Platelets WILL FOLLOW; Potassium 4.8; Sodium 145   Recent Lipid Panel Lab Results  Component Value Date/Time   CHOL 120 10/22/2018 08:08 AM   TRIG 59 10/22/2018 08:08 AM   HDL 34 (L) 10/22/2018 08:08 AM   CHOLHDL 3.5 10/22/2018 08:08 AM   CHOLHDL 6.6 08/29/2018 03:00 AM   LDLCALC 74 10/22/2018 08:08 AM    Wt Readings from Last 3 Encounters:  01/06/19 185 lb (83.9 kg)  10/15/18 187 lb (84.8 kg)  09/12/18 187 lb 12.8 oz (85.2 kg)     Objective:    Vital Signs:  Ht  (1.778  m)   Wt 185 lb (83.9 kg)   BMI 26.54 kg/m    VITAL SIGNS:  reviewed  ASSESSMENT & PLAN:    Coronary artery disease: Todd Reynolds has recovered well from his NSTEMI with PCI to the LAD in 08/2018.  We will continue least 12 months of DAPT as well as his current regimen for secondary prevention.  I encouraged him to continue exercising.  Ischemic cardiomyopathy: Todd Reynolds denies signs or symptoms of decompensated heart failure.  He has NYHA class I.  We will continue current doses of carvedilol and lisinopril.  Hyperlipidemia: Follow-up LDL in February was just above goal at 74.  He is on atorvastatin 80 mg daily.  We will plan to continue this.  I also encouraged lifestyle modifications.  We will plan to repeat a lipid panel in about 6 months.  If LDL remains above goal, addition of ezetimibe or PCSK9 inhibitor will need to be considered.  COVID-19 Education: The signs and symptoms of COVID-19 were discussed with the  patient and how to seek care for testing (follow up with PCP or arrange E-visit).  The importance of social distancing was discussed today.  Time:   Today, I have spent 11 minutes with the patient with telehealth technology discussing the above problems.     Medication Adjustments/Labs and Tests Ordered: Current medicines are reviewed at length with the patient today.  Concerns regarding medicines are outlined above.   Tests Ordered: No orders of the defined types were placed in this encounter.   Medication Changes: No orders of the defined types were placed in this encounter.   Disposition:  Follow up in 6 month(s)  Signed, Yvonne Kendallhristopher Abdishakur Gottschall, MD  01/06/2019 9:59 AM    [facility]

## 2019-01-06 ENCOUNTER — Telehealth (INDEPENDENT_AMBULATORY_CARE_PROVIDER_SITE_OTHER): Payer: Self-pay | Admitting: Internal Medicine

## 2019-01-06 ENCOUNTER — Encounter: Payer: Self-pay | Admitting: Internal Medicine

## 2019-01-06 ENCOUNTER — Other Ambulatory Visit: Payer: Self-pay

## 2019-01-06 VITALS — Ht 70.0 in | Wt 185.0 lb

## 2019-01-06 DIAGNOSIS — I252 Old myocardial infarction: Secondary | ICD-10-CM

## 2019-01-06 DIAGNOSIS — E785 Hyperlipidemia, unspecified: Secondary | ICD-10-CM

## 2019-01-06 DIAGNOSIS — Z955 Presence of coronary angioplasty implant and graft: Secondary | ICD-10-CM

## 2019-01-06 DIAGNOSIS — I2510 Atherosclerotic heart disease of native coronary artery without angina pectoris: Secondary | ICD-10-CM | POA: Insufficient documentation

## 2019-01-06 DIAGNOSIS — I255 Ischemic cardiomyopathy: Secondary | ICD-10-CM | POA: Insufficient documentation

## 2019-01-06 NOTE — Patient Instructions (Signed)
Medication Instructions:  Your physician recommends that you continue on your current medications as directed. Please refer to the Current Medication list given to you today.  If you need a refill on your cardiac medications before your next appointment, please call your pharmacy.   Lab work: none If you have labs (blood work) drawn today and your tests are completely normal, you will receive your results only by: . MyChart Message (if you have MyChart) OR . A paper copy in the mail If you have any lab test that is abnormal or we need to change your treatment, we will call you to review the results.  Testing/Procedures: none  Follow-Up: At [facility], you and your health needs are our priority.  As part of our continuing mission to provide you with exceptional heart care, we have created designated Provider Care Teams.  These Care Teams include your primary Cardiologist (physician) and Advanced Practice Providers (APPs -  Physician Assistants and Nurse Practitioners) who all work together to provide you with the care you need, when you need it. You will need a follow up appointment in 6 months.  Please call our office 2 months in advance to schedule this appointment.  You may see Christopher End, MD or one of the following Advanced Practice Providers on your designated Care Team:   Christopher Berge, NP Ryan Dunn, PA-C . Jacquelyn Visser, PA-C     

## 2019-01-15 ENCOUNTER — Telehealth: Payer: Self-pay | Admitting: Internal Medicine

## 2019-01-21 ENCOUNTER — Other Ambulatory Visit: Payer: Self-pay | Admitting: Gerontology

## 2019-01-21 DIAGNOSIS — I2510 Atherosclerotic heart disease of native coronary artery without angina pectoris: Secondary | ICD-10-CM

## 2019-02-19 ENCOUNTER — Other Ambulatory Visit: Payer: Self-pay

## 2019-02-19 ENCOUNTER — Other Ambulatory Visit: Payer: Self-pay | Admitting: Gerontology

## 2019-02-19 DIAGNOSIS — I214 Non-ST elevation (NSTEMI) myocardial infarction: Secondary | ICD-10-CM

## 2019-02-19 DIAGNOSIS — E782 Mixed hyperlipidemia: Secondary | ICD-10-CM

## 2019-02-19 MED ORDER — ASPIRIN 81 MG PO TBEC: 81.0000 mg | DELAYED_RELEASE_TABLET | Freq: Every day | ORAL | 2 refills | Status: AC

## 2019-03-05 ENCOUNTER — Other Ambulatory Visit: Payer: Self-pay

## 2019-03-13 ENCOUNTER — Ambulatory Visit: Payer: Self-pay | Admitting: Gerontology

## 2019-03-27 ENCOUNTER — Other Ambulatory Visit: Payer: Self-pay

## 2019-03-27 ENCOUNTER — Other Ambulatory Visit: Payer: Medicaid Other

## 2019-03-27 DIAGNOSIS — E782 Mixed hyperlipidemia: Secondary | ICD-10-CM

## 2019-03-27 DIAGNOSIS — I10 Essential (primary) hypertension: Secondary | ICD-10-CM

## 2019-03-28 LAB — HEPATIC FUNCTION PANEL
ALT: 31 IU/L (ref 0–44)
AST: 25 IU/L (ref 0–40)
Albumin: 4.2 g/dL (ref 3.8–4.8)
Alkaline Phosphatase: 75 IU/L (ref 39–117)
Bilirubin Total: 1.3 mg/dL — ABNORMAL HIGH (ref 0.0–1.2)
Bilirubin, Direct: 0.32 mg/dL (ref 0.00–0.40)
Total Protein: 6.5 g/dL (ref 6.0–8.5)

## 2019-03-28 LAB — LIPID PANEL
Chol/HDL Ratio: 4 ratio (ref 0.0–5.0)
Cholesterol, Total: 115 mg/dL (ref 100–199)
HDL: 29 mg/dL — ABNORMAL LOW (ref >39)
LDL Calculated: 67 mg/dL (ref 0–99)
Triglycerides: 97 mg/dL (ref 0–149)
VLDL Cholesterol Cal: 19 mg/dL (ref 5–40)

## 2019-03-28 LAB — CBC WITH DIFFERENTIAL/PLATELET
Basophils Absolute: 0 10*3/uL (ref 0.0–0.2)
Basos: 1 %
EOS (ABSOLUTE): 0.2 10*3/uL (ref 0.0–0.4)
Eos: 4 %
Hematocrit: 43.1 % (ref 37.5–51.0)
Hemoglobin: 14.3 g/dL (ref 13.0–17.7)
Immature Grans (Abs): 0 10*3/uL (ref 0.0–0.1)
Immature Granulocytes: 0 %
Lymphocytes Absolute: 1.8 10*3/uL (ref 0.7–3.1)
Lymphs: 31 %
MCH: 29.4 pg (ref 26.6–33.0)
MCHC: 33.2 g/dL (ref 31.5–35.7)
MCV: 89 fL (ref 79–97)
Monocytes Absolute: 0.4 10*3/uL (ref 0.1–0.9)
Monocytes: 7 %
Neutrophils Absolute: 3.4 10*3/uL (ref 1.4–7.0)
Neutrophils: 57 %
Platelets: 214 10*3/uL (ref 150–450)
RBC: 4.86 x10E6/uL (ref 4.14–5.80)
RDW: 13.6 % (ref 11.6–15.4)
WBC: 5.8 10*3/uL (ref 3.4–10.8)

## 2019-03-28 LAB — HEMOGLOBIN A1C
Est. average glucose Bld gHb Est-mCnc: 134 mg/dL
Hgb A1c MFr Bld: 6.3 % — ABNORMAL HIGH (ref 4.8–5.6)

## 2019-03-28 LAB — MICROALBUMIN / CREATININE URINE RATIO
Creatinine, Urine: 241.9 mg/dL
Microalb/Creat Ratio: 2 mg/g creat (ref 0–29)
Microalbumin, Urine: 5 ug/mL

## 2019-04-02 ENCOUNTER — Other Ambulatory Visit: Payer: Self-pay

## 2019-04-02 ENCOUNTER — Ambulatory Visit: Payer: Medicaid Other | Admitting: Gerontology

## 2019-04-02 ENCOUNTER — Encounter: Payer: Self-pay | Admitting: Gerontology

## 2019-04-02 DIAGNOSIS — I10 Essential (primary) hypertension: Secondary | ICD-10-CM | POA: Insufficient documentation

## 2019-04-02 DIAGNOSIS — R7303 Prediabetes: Secondary | ICD-10-CM

## 2019-04-02 DIAGNOSIS — E785 Hyperlipidemia, unspecified: Secondary | ICD-10-CM

## 2019-04-02 DIAGNOSIS — I2510 Atherosclerotic heart disease of native coronary artery without angina pectoris: Secondary | ICD-10-CM

## 2019-04-02 MED ORDER — METFORMIN HCL 1000 MG PO TABS: 500.0000 mg | ORAL_TABLET | Freq: Two times a day (BID) | ORAL | 0 refills | Status: AC

## 2019-04-02 NOTE — Progress Notes (Signed)
Established Patient Office Visit  Subjective:  Patient ID: Todd Reynolds, male    DOB: Jan 22, 1956  Age: 63 y.o. MRN: [id number]  CC: No chief complaint on file. Patient consents to telephone visit and 2 patient identifiers was used to identify patient.  HPI Todd Reynolds presents for follow up of hypertension, hyperlipidemia and coronary artery disease. He had a follow up appointment on 01/06/19 with Dr End C for CAD. He recommends he continues on current doses of carvedilol,lisinopril, Atorvastatin 80 mg, and repeat lipid panel by October and if LDL remains above goal ezetimibe or PCSK 9 will be considered. He continues to take lisinopril 10 mg and Metoprolol 12.5 mg bid. He checks his blood pressure at home and reports that it's normal and he's compliant with dash diet and exercises as tolerated. He denies chest pain, palpitation and light headedness. His lipid panel done 6 days  ago, total cholesterol was 115, Triglycerides 97, HDL 29 and LDL 67. He continues to take 80 mg Atorvastatin and denies myalgias and motor weakness. His HgbA1c done 6 days ago was 6.3% and he states that he will modify his carbohydrate intake. He denies fever, chills and no further concerns.  Past Medical History:  Diagnosis Date  . CAD with hx of NSTEMI    a. 08/2018 NSTEMI/PCI: LM 20d, LAD 50p/d, D1 85ost, RI 90, LCX 1466m/d - thrombotic (3.0x15 [place] DES), OM1 small/nl, OM2 mod/nl, OM3 large - fills via dLAD collats, RCA 40p/m, RPDA1 60, RPDA2 75, RPAV mod/nl. EF 45-50%.  . Hyperlipidemia LDL goal <70   . Ischemic cardiomyopathy    a. 08/2018 LV gram: EF 45-50%; b. 08/2018 Echo: EF 50-55%, mild inflat HK. Mild MR, mildly dil LA/RA.    Past Surgical History:  Procedure Laterality Date  . CORONARY STENT INTERVENTION N/A 08/28/2018   Procedure: CORONARY STENT INTERVENTION;  Surgeon: Yvonne KendallEnd, Christopher, MD;  Location: [facility];  Service: Cardiovascular;  Laterality: N/A;  . CORONARY THROMBECTOMY N/A  08/28/2018   Procedure: Coronary Thrombectomy;  Surgeon: Yvonne KendallEnd, Christopher, MD;  Location: [facility];  Service: Cardiovascular;  Laterality: N/A;  . LEFT HEART CATH AND CORONARY ANGIOGRAPHY N/A 08/28/2018   Procedure: LEFT HEART CATH AND CORONARY ANGIOGRAPHY;  Surgeon: Yvonne KendallEnd, Christopher, MD;  Location: [facility];  Service: Cardiovascular;  Laterality: N/A;    Family History  Problem Relation Age of Onset  . Hypertension Sister   . Heart attack Sister   . Diabetes Sister   . Heart disease Brother     Social History   Socioeconomic History  . Marital status: Single    Spouse name: Not on file  . Number of children: Not on file  . Years of education: Not on file  . Highest education level: Not on file  Occupational History  . Not on file  Social Needs  . Financial resource strain: Not on file  . Food insecurity    Worry: Not on file    Inability: Not on file  . Transportation needs    Medical: Not on file    Non-medical: Not on file  Tobacco Use  . Smoking status: Never Smoker  . Smokeless tobacco: Never Used  Substance and Sexual Activity  . Alcohol use: Never    Frequency: Never  . Drug use: Never  . Sexual activity: Not on file  Lifestyle  . Physical activity    Days per week: Not on file    Minutes per session: Not on file  .  Stress: Not on file  Relationships  . Social Herbalist on phone: Not on file    Gets together: Not on file    Attends religious service: Not on file    Active member of club or organization: Not on file    Attends meetings of clubs or organizations: Not on file    Relationship status: Not on file  . Intimate partner violence    Fear of current or ex partner: Not on file    Emotionally abused: Not on file    Physically abused: Not on file    Forced sexual activity: Not on file  Other Topics Concern  . Not on file  Social History Narrative  . Not on file    Outpatient Medications Prior to Visit   Medication Sig Dispense Refill  . aspirin 81 MG EC tablet Take 1 tablet (81 mg total) by mouth daily. 30 tablet 2  . atorvastatin (LIPITOR) 80 MG tablet Take 1 tablet (80 mg total) by mouth daily at 6 PM. 90 tablet 3  . clopidogrel (PLAVIX) 75 MG tablet Take 1 tablet (75 mg total) by mouth daily. 30 tablet 11  . lisinopril (PRINIVIL,ZESTRIL) 10 MG tablet Take 1 tablet (10 mg total) by mouth daily. 90 tablet 3  . metoprolol tartrate (LOPRESSOR) 25 MG tablet TAKE 1/2 TABLET (12.5MG ) BY MOUTH 2 TIMES A Taylor 90 tablet 0  . nitroGLYCERIN (NITROSTAT) 0.4 MG SL tablet Place 1 tablet (0.4 mg total) under the tongue every 5 (five) minutes as needed for chest pain. 30 tablet 3   No facility-administered medications prior to visit.     No Known Allergies  ROS Review of Systems  Constitutional: Negative.   HENT: Negative.   Respiratory: Negative.   Cardiovascular: Negative.   Gastrointestinal: Negative.   Genitourinary: Negative.   Musculoskeletal: Negative.   Skin: Negative.   Neurological: Negative.   Psychiatric/Behavioral: Negative.       Objective:    Physical Exam No vital sign or PE was done. There were no vitals taken for this visit. Wt Readings from Last 3 Encounters:  01/06/19 185 lb (83.9 kg)  10/15/18 187 lb (84.8 kg)  09/12/18 187 lb 12.8 oz (85.2 kg)     Health Maintenance Due  Topic Date Due  . Hepatitis C Screening  1956/03/20  . TETANUS/TDAP  09/29/1976  . COLONOSCOPY  09/30/2007    There are no preventive care reminders to display for this patient.  No results found for: TSH Lab Results  Component Value Date   WBC 5.8 03/27/2019   HGB 14.3 03/27/2019   HCT 43.1 03/27/2019   MCV 89 03/27/2019   PLT 214 03/27/2019   Lab Results  Component Value Date   NA 145 (H) 10/22/2018   K 4.8 10/22/2018   CO2 23 10/22/2018   GLUCOSE 100 (H) 10/22/2018   BUN 12 10/22/2018   CREATININE 1.10 10/22/2018   BILITOT 1.3 (H) 03/27/2019   ALKPHOS 75 03/27/2019    AST 25 03/27/2019   ALT 31 03/27/2019   PROT 6.5 03/27/2019   ALBUMIN 4.2 03/27/2019   CALCIUM 9.4 10/22/2018   ANIONGAP 6 08/29/2018   Lab Results  Component Value Date   CHOL 115 03/27/2019   Lab Results  Component Value Date   HDL 29 (L) 03/27/2019   Lab Results  Component Value Date   LDLCALC 67 03/27/2019   Lab Results  Component Value Date   TRIG 97 03/27/2019   Lab  Results  Component Value Date   CHOLHDL 4.0 03/27/2019   Lab Results  Component Value Date   HGBA1C 6.3 (H) 03/27/2019      Assessment & Plan:     1. Prediabetes - He will start metformin and was educated on the side effects and advised to notify clinic. - metFORMIN (GLUCOPHAGE) 1000 MG tablet; Take 0.5 tablets (500 mg total) by mouth 2 (two) times daily with a meal.  Dispense: 60 tablet; Refill:  - He was advised on low carb/ non concentrated sweet diet. -Regular exercise   2. Hyperlipidemia LDL goal <70 - His LDL was 67 and it's at goal. He will continue on current treatment regimen.  3. Essential hypertension - He will continue on current treatment regimen. -Low salt DASH diet -Take medications regularly on time -Exercise regularly as tolerated -Check blood pressure at least once a week at home record and bring log to clinic. -Goal is less than 150/90 and normal blood pressure is 120/80   4. Coronary artery disease involving native coronary artery of native heart without angina pectoris - He will continue on current treatment regimen, and advised to notify clinic for symptoms.   Follow-up: Return in about 3 weeks (around 04/23/2019), or if symptoms worsen or fail to improve.    Laylaa Guevarra Trellis PaganiniE Uziel Covault, NP

## 2019-04-02 NOTE — Patient Instructions (Signed)
Carbohydrate Counting for Diabetes Mellitus, Adult  Carbohydrate counting is a method of keeping track of how many carbohydrates you eat. Eating carbohydrates naturally increases the amount of sugar (glucose) in the blood. Counting how many carbohydrates you eat helps keep your blood glucose within normal limits, which helps you manage your diabetes (diabetes mellitus). It is important to know how many carbohydrates you can safely have in each meal. This is different for every person. A diet and nutrition specialist (registered dietitian) can help you make a meal plan and calculate how many carbohydrates you should have at each meal and snack. Carbohydrates are found in the following foods:  Grains, such as breads and cereals.  Dried beans and soy products.  Starchy vegetables, such as potatoes, peas, and corn.  Fruit and fruit juices.  Milk and yogurt.  Sweets and snack foods, such as cake, cookies, candy, chips, and soft drinks. How do I count carbohydrates? There are two ways to count carbohydrates in food. You can use either of the methods or a combination of both. Reading "Nutrition Facts" on packaged food The "Nutrition Facts" list is included on the labels of almost all packaged foods and beverages in the [place] It includes:  The serving size.  Information about nutrients in each serving, including the grams (g) of carbohydrate per serving. To use the "Nutrition Facts":  Decide how many servings you will have.  Multiply the number of servings by the number of carbohydrates per serving.  The resulting number is the total amount of carbohydrates that you will be having. Learning standard serving sizes of other foods When you eat carbohydrate foods that are not packaged or do not include "Nutrition Facts" on the label, you need to measure the servings in order to count the amount of carbohydrates:  Measure the foods that you will eat with a food scale or measuring cup, if needed.   Decide how many standard-size servings you will eat.  Multiply the number of servings by 15. Most carbohydrate-rich foods have about 15 g of carbohydrates per serving. ? For example, if you eat 8 oz (170 g) of strawberries, you will have eaten 2 servings and 30 g of carbohydrates (2 servings x 15 g = 30 g).  For foods that have more than one food mixed, such as soups and casseroles, you must count the carbohydrates in each food that is included. The following list contains standard serving sizes of common carbohydrate-rich foods. Each of these servings has about 15 g of carbohydrates:   hamburger bun or  English muffin.   oz (15 mL) syrup.   oz (14 g) jelly.  1 slice of bread.  1 six-inch tortilla.  3 oz (85 g) cooked rice or pasta.  4 oz (113 g) cooked dried beans.  4 oz (113 g) starchy vegetable, such as peas, corn, or potatoes.  4 oz (113 g) hot cereal.  4 oz (113 g) mashed potatoes or  of a large baked potato.  4 oz (113 g) canned or frozen fruit.  4 oz (120 mL) fruit juice.  4-6 crackers.  6 chicken nuggets.  6 oz (170 g) unsweetened dry cereal.  6 oz (170 g) plain fat-free yogurt or yogurt sweetened with artificial sweeteners.  8 oz (240 mL) milk.  8 oz (170 g) fresh fruit or one small piece of fruit.  24 oz (680 g) popped popcorn. Example of carbohydrate counting Sample meal  3 oz (85 g) chicken breast.  6 oz (170 g)   brown rice.  4 oz (113 g) corn.  8 oz (240 mL) milk.  8 oz (170 g) strawberries with sugar-free whipped topping. Carbohydrate calculation 1. Identify the foods that contain carbohydrates: ? Rice. ? Corn. ? Milk. ? Strawberries. 2. Calculate how many servings you have of each food: ? 2 servings rice. ? 1 serving corn. ? 1 serving milk. ? 1 serving strawberries. 3. Multiply each number of servings by 15 g: ? 2 servings rice x 15 g = 30 g. ? 1 serving corn x 15 g = 15 g. ? 1 serving milk x 15 g = 15 g. ? 1 serving  strawberries x 15 g = 15 g. 4. Add together all of the amounts to find the total grams of carbohydrates eaten: ? 30 g + 15 g + 15 g + 15 g = 75 g of carbohydrates total. Summary  Carbohydrate counting is a method of keeping track of how many carbohydrates you eat.  Eating carbohydrates naturally increases the amount of sugar (glucose) in the blood.  Counting how many carbohydrates you eat helps keep your blood glucose within normal limits, which helps you manage your diabetes.  A diet and nutrition specialist (registered dietitian) can help you make a meal plan and calculate how many carbohydrates you should have at each meal and snack. This information is not intended to replace advice given to you by your health care provider. Make sure you discuss any questions you have with your health care provider. Document Released: 09/04/2005 Document Revised: 03/29/2017 Document Reviewed: 02/16/2016 Elsevier Patient Education  2020 [employer] Fat and Cholesterol Restricted Eating Plan Getting too much fat and cholesterol in your diet may cause health problems. Choosing the right foods helps keep your fat and cholesterol at normal levels. This can keep you from getting certain diseases. Your doctor may recommend an eating plan that includes:  Total fat: ______% or less of total calories a Salas.  Saturated fat: ______% or less of total calories a Arvanitis.  Cholesterol: less than _________mg a Chrisley.  Fiber: ______g a Barcellos. What are tips for following this plan? Meal planning  At meals, divide your plate into four equal parts: ? Fill one-half of your plate with vegetables and green salads. ? Fill one-fourth of your plate with whole grains. ? Fill one-fourth of your plate with low-fat (lean) protein foods.  Eat fish that is high in omega-3 fats at least two times a week. This includes mackerel, tuna, sardines, and salmon.  Eat foods that are high in fiber, such as whole grains, beans, apples,  broccoli, carrots, peas, and barley. General tips   Work with your doctor to lose weight if you need to.  Avoid: ? Foods with added sugar. ? Fried foods. ? Foods with partially hydrogenated oils.  Limit alcohol intake to no more than 1 drink a Ruegg for nonpregnant women and 2 drinks a Vanzile for men. One drink equals 12 oz of beer, 5 oz of wine, or 1 oz of hard liquor. Reading food labels  Check food labels for: ? Trans fats. ? Partially hydrogenated oils. ? Saturated fat (g) in each serving. ? Cholesterol (mg) in each serving. ? Fiber (g) in each serving.  Choose foods with healthy fats, such as: ? Monounsaturated fats. ? Polyunsaturated fats. ? Omega-3 fats.  Choose grain products that have whole grains. Look for the word "whole" as the first word in the ingredient list. Cooking  Cook foods using low-fat methods. These include baking,  boiling, grilling, and broiling.  Eat more home-cooked foods. Eat at restaurants and buffets less often.  Avoid cooking using saturated fats, such as butter, cream, palm oil, palm kernel oil, and coconut oil. Recommended foods  Fruits  All fresh, canned (in natural juice), or frozen fruits. Vegetables  Fresh or frozen vegetables (raw, steamed, roasted, or grilled). Green salads. Grains  Whole grains, such as whole wheat or whole grain breads, crackers, cereals, and pasta. Unsweetened oatmeal, bulgur, barley, quinoa, or brown rice. Corn or whole wheat flour tortillas. Meats and other protein foods  Ground beef (85% or leaner), grass-fed beef, or beef trimmed of fat. Skinless chicken or [place]. Ground chicken or [place]. Pork trimmed of fat. All fish and seafood. Egg whites. Dried beans, peas, or lentils. Unsalted nuts or seeds. Unsalted canned beans. Nut butters without added sugar or oil. Dairy  Low-fat or nonfat dairy products, such as skim or 1% milk, 2% or reduced-fat cheeses, low-fat and fat-free ricotta or cottage cheese, or plain  low-fat and nonfat yogurt. Fats and oils  Tub margarine without trans fats. Light or reduced-fat mayonnaise and salad dressings. Avocado. Olive, canola, sesame, or safflower oils. The items listed above may not be a complete list of foods and beverages you can eat. Contact a dietitian for more information. Foods to avoid Fruits  Canned fruit in heavy syrup. Fruit in cream or butter sauce. Fried fruit. Vegetables  Vegetables cooked in cheese, cream, or butter sauce. Fried vegetables. Grains  White bread. White pasta. White rice. Cornbread. Bagels, pastries, and croissants. Crackers and snack foods that contain trans fat and hydrogenated oils. Meats and other protein foods  Fatty cuts of meat. Ribs, chicken wings, bacon, sausage, bologna, salami, chitterlings, fatback, hot dogs, bratwurst, and packaged lunch meats. Liver and organ meats. Whole eggs and egg yolks. Chicken and [place] with skin. Fried meat. Dairy  Whole or 2% milk, cream, half-and-half, and cream cheese. Whole milk cheeses. Whole-fat or sweetened yogurt. Full-fat cheeses. Nondairy creamers and whipped toppings. Processed cheese, cheese spreads, and cheese curds. Beverages  Alcohol. Sugar-sweetened drinks such as sodas, lemonade, and fruit drinks. Fats and oils  Butter, stick margarine, lard, shortening, ghee, or bacon fat. Coconut, palm kernel, and palm oils. Sweets and desserts  Corn syrup, sugars, honey, and molasses. Candy. Jam and jelly. Syrup. Sweetened cereals. Cookies, pies, cakes, donuts, muffins, and ice cream. The items listed above may not be a complete list of foods and beverages you should avoid. Contact a dietitian for more information. Summary  Choosing the right foods helps keep your fat and cholesterol at normal levels. This can keep you from getting certain diseases.  At meals, fill one-half of your plate with vegetables and green salads.  Eat high-fiber foods, like whole grains, beans, apples,  carrots, peas, and barley.  Limit added sugar, saturated fats, alcohol, and fried foods. This information is not intended to replace advice given to you by your health care provider. Make sure you discuss any questions you have with your health care provider. Document Released: 03/05/2012 Document Revised: 05/08/2018 Document Reviewed: 05/22/2017 Elsevier Patient Education  2020 [employer] Inc. DASH Eating Plan DASH stands for "Dietary Approaches to Stop Hypertension." The DASH eating plan is a healthy eating plan that has been shown to reduce high blood pressure (hypertension). It may also reduce your risk for type 2 diabetes, heart disease, and stroke. The DASH eating plan may also help with weight loss. What are tips for following this plan?  General guidelines  Avoid eating more than 2,300 mg (milligrams) of salt (sodium) a Murnane. If you have hypertension, you may need to reduce your sodium intake to 1,500 mg a Frasco.  Limit alcohol intake to no more than 1 drink a Dhaliwal for nonpregnant women and 2 drinks a Perfetti for men. One drink equals 12 oz of beer, 5 oz of wine, or 1 oz of hard liquor.  Work with your health care provider to maintain a healthy body weight or to lose weight. Ask what an ideal weight is for you.  Get at least 30 minutes of exercise that causes your heart to beat faster (aerobic exercise) most days of the week. Activities may include walking, swimming, or biking.  Work with your health care provider or diet and nutrition specialist (dietitian) to adjust your eating plan to your individual calorie needs. Reading food labels   Check food labels for the amount of sodium per serving. Choose foods with less than 5 percent of the Daily Value of sodium. Generally, foods with less than 300 mg of sodium per serving fit into this eating plan.  To find whole grains, look for the word "whole" as the first word in the ingredient list. Shopping  Buy products labeled as "low-sodium" or  "no salt added."  Buy fresh foods. Avoid canned foods and premade or frozen meals. Cooking  Avoid adding salt when cooking. Use salt-free seasonings or herbs instead of table salt or sea salt. Check with your health care provider or pharmacist before using salt substitutes.  Do not fry foods. Cook foods using healthy methods such as baking, boiling, grilling, and broiling instead.  Cook with heart-healthy oils, such as olive, canola, soybean, or sunflower oil. Meal planning  Eat a balanced diet that includes: ? 5 or more servings of fruits and vegetables each Garrette. At each meal, try to fill half of your plate with fruits and vegetables. ? Up to 6-8 servings of whole grains each Nedeau. ? Less than 6 oz of lean meat, poultry, or fish each Wist. A 3-oz serving of meat is about the same size as a deck of cards. One egg equals 1 oz. ? 2 servings of low-fat dairy each Seth. ? A serving of nuts, seeds, or beans 5 times each week. ? Heart-healthy fats. Healthy fats called Omega-3 fatty acids are found in foods such as flaxseeds and coldwater fish, like sardines, salmon, and mackerel.  Limit how much you eat of the following: ? Canned or prepackaged foods. ? Food that is high in trans fat, such as fried foods. ? Food that is high in saturated fat, such as fatty meat. ? Sweets, desserts, sugary drinks, and other foods with added sugar. ? Full-fat dairy products.  Do not salt foods before eating.  Try to eat at least 2 vegetarian meals each week.  Eat more home-cooked food and less restaurant, buffet, and fast food.  When eating at a restaurant, ask that your food be prepared with less salt or no salt, if possible. What foods are recommended? The items listed may not be a complete list. Talk with your dietitian about what dietary choices are best for you. Grains Whole-grain or whole-wheat bread. Whole-grain or whole-wheat pasta. Brown rice. Orpah Cobbatmeal. Quinoa. Bulgur. Whole-grain and low-sodium  cereals. Pita bread. Low-fat, low-sodium crackers. Whole-wheat flour tortillas. Vegetables Fresh or frozen vegetables (raw, steamed, roasted, or grilled). Low-sodium or reduced-sodium tomato and vegetable juice. Low-sodium or reduced-sodium tomato sauce and tomato paste. Low-sodium or reduced-sodium canned vegetables. Fruits  All fresh, dried, or frozen fruit. Canned fruit in natural juice (without added sugar). Meat and other protein foods Skinless chicken or [place]. Ground chicken or [place]. Pork with fat trimmed off. Fish and seafood. Egg whites. Dried beans, peas, or lentils. Unsalted nuts, nut butters, and seeds. Unsalted canned beans. Lean cuts of beef with fat trimmed off. Low-sodium, lean deli meat. Dairy Low-fat (1%) or fat-free (skim) milk. Fat-free, low-fat, or reduced-fat cheeses. Nonfat, low-sodium ricotta or cottage cheese. Low-fat or nonfat yogurt. Low-fat, low-sodium cheese. Fats and oils Soft margarine without trans fats. Vegetable oil. Low-fat, reduced-fat, or light mayonnaise and salad dressings (reduced-sodium). Canola, safflower, olive, soybean, and sunflower oils. Avocado. Seasoning and other foods Herbs. Spices. Seasoning mixes without salt. Unsalted popcorn and pretzels. Fat-free sweets. What foods are not recommended? The items listed may not be a complete list. Talk with your dietitian about what dietary choices are best for you. Grains Baked goods made with fat, such as croissants, muffins, or some breads. Dry pasta or rice meal packs. Vegetables Creamed or fried vegetables. Vegetables in a cheese sauce. Regular canned vegetables (not low-sodium or reduced-sodium). Regular canned tomato sauce and paste (not low-sodium or reduced-sodium). Regular tomato and vegetable juice (not low-sodium or reduced-sodium). Angie Fava. Olives. Fruits Canned fruit in a light or heavy syrup. Fried fruit. Fruit in cream or butter sauce. Meat and other protein foods Fatty cuts of meat. Ribs.  Fried meat. Berniece Salines. Sausage. Bologna and other processed lunch meats. Salami. Fatback. Hotdogs. Bratwurst. Salted nuts and seeds. Canned beans with added salt. Canned or smoked fish. Whole eggs or egg yolks. Chicken or [place] with skin. Dairy Whole or 2% milk, cream, and half-and-half. Whole or full-fat cream cheese. Whole-fat or sweetened yogurt. Full-fat cheese. Nondairy creamers. Whipped toppings. Processed cheese and cheese spreads. Fats and oils Butter. Stick margarine. Lard. Shortening. Ghee. Bacon fat. Tropical oils, such as coconut, palm kernel, or palm oil. Seasoning and other foods Salted popcorn and pretzels. Onion salt, garlic salt, seasoned salt, table salt, and sea salt. Worcestershire sauce. Tartar sauce. Barbecue sauce. Teriyaki sauce. Soy sauce, including reduced-sodium. Steak sauce. Canned and packaged gravies. Fish sauce. Oyster sauce. Cocktail sauce. Horseradish that you find on the shelf. Ketchup. Mustard. Meat flavorings and tenderizers. Bouillon cubes. Hot sauce and Tabasco sauce. Premade or packaged marinades. Premade or packaged taco seasonings. Relishes. Regular salad dressings. Where to find more information:  National Heart, Lung, and [place]: https://wilson-eaton.com/  American Heart Association: www.heart.org Summary  The DASH eating plan is a healthy eating plan that has been shown to reduce high blood pressure (hypertension). It may also reduce your risk for type 2 diabetes, heart disease, and stroke.  With the DASH eating plan, you should limit salt (sodium) intake to 2,300 mg a Livas. If you have hypertension, you may need to reduce your sodium intake to 1,500 mg a Raftery.  When on the DASH eating plan, aim to eat more fresh fruits and vegetables, whole grains, lean proteins, low-fat dairy, and heart-healthy fats.  Work with your health care provider or diet and nutrition specialist (dietitian) to adjust your eating plan to your individual calorie needs. This  information is not intended to replace advice given to you by your health care provider. Make sure you discuss any questions you have with your health care provider. Document Released: 08/24/2011 Document Revised: 08/17/2017 Document Reviewed: 08/28/2016 Elsevier Patient Education  2020 [employer].

## 2019-04-03 ENCOUNTER — Telehealth: Payer: Self-pay

## 2019-04-03 NOTE — Telephone Encounter (Signed)
Tried calling patient to schedule followup phone appt for 8/3 with Benjamine Mola. No answer. Mailbox is full. Patient may have Medicaid an no longer able to be seen at [facility]. Verify if patient calls the clinic.

## 2019-04-10 ENCOUNTER — Telehealth: Payer: Self-pay | Admitting: Pharmacy Technician

## 2019-04-10 NOTE — Telephone Encounter (Signed)
Patient has Medicaid. [facility] is no longer able to assist patient. If he were to lose his Medicaid, he could potentially become a patient of our clinic again once going through the enrollment process. A denial letter was mailed to patient April 04, 2019.  Velda Shell CPhT/Certification Specialist

## 2019-07-11 ENCOUNTER — Other Ambulatory Visit: Payer: Self-pay

## 2019-07-11 ENCOUNTER — Ambulatory Visit (INDEPENDENT_AMBULATORY_CARE_PROVIDER_SITE_OTHER): Payer: Medicaid Other | Admitting: Internal Medicine

## 2019-07-11 ENCOUNTER — Encounter: Payer: Self-pay | Admitting: Internal Medicine

## 2019-07-11 VITALS — BP 118/82 | HR 63 | Ht 70.0 in | Wt 190.5 lb

## 2019-07-11 DIAGNOSIS — I2510 Atherosclerotic heart disease of native coronary artery without angina pectoris: Secondary | ICD-10-CM | POA: Diagnosis not present

## 2019-07-11 DIAGNOSIS — I10 Essential (primary) hypertension: Secondary | ICD-10-CM

## 2019-07-11 DIAGNOSIS — E785 Hyperlipidemia, unspecified: Secondary | ICD-10-CM

## 2019-07-11 DIAGNOSIS — N529 Male erectile dysfunction, unspecified: Secondary | ICD-10-CM

## 2019-07-11 MED ORDER — ATORVASTATIN CALCIUM 80 MG PO TABS: 80.0000 mg | ORAL_TABLET | Freq: Every day | ORAL | 3 refills | Status: DC

## 2019-07-11 MED ORDER — SILDENAFIL CITRATE 20 MG PO TABS: ORAL_TABLET | ORAL | 1 refills | Status: DC

## 2019-07-11 MED ORDER — METOPROLOL TARTRATE 25 MG PO TABS: ORAL_TABLET | ORAL | 3 refills | Status: DC

## 2019-07-11 MED ORDER — CLOPIDOGREL BISULFATE 75 MG PO TABS: 75.0000 mg | ORAL_TABLET | Freq: Every day | ORAL | 3 refills | Status: DC

## 2019-07-11 MED ORDER — LISINOPRIL 10 MG PO TABS: 10.0000 mg | ORAL_TABLET | Freq: Every day | ORAL | 3 refills | Status: DC

## 2019-07-11 NOTE — Patient Instructions (Addendum)
Medication Instructions:  - Your physician has recommended you make the following change in your medication:   1) Start viagra (sildenifil) 20 mg- take 1 tablet by mouth once daily as needed for erectile dysfunction  *If you need a refill on your cardiac medications before your next appointment, please call your pharmacy*  Lab Work: - none ordered  If you have labs (blood work) drawn today and your tests are completely normal, you will receive your results only by: Marland Kitchen MyChart Message (if you have MyChart) OR . A paper copy in the mail If you have any lab test that is abnormal or we need to change your treatment, we will call you to review the results.  Testing/Procedures: - none ordered  Follow-Up: At [facility], you and your health needs are our priority.  As part of our continuing mission to provide you with exceptional heart care, we have created designated Provider Care Teams.  These Care Teams include your primary Cardiologist (physician) and Advanced Practice Providers (APPs -  Physician Assistants and Nurse Practitioners) who all work together to provide you with the care you need, when you need it.  Your next appointment:   6 months  The format for your next appointment:   In Person  Provider:    You may see Nelva Bush, MD or one of the following Advanced Practice Providers on your designated Care Team:    Murray Hodgkins, NP  Christell Faith, PA-C  Marrianne Mood, PA-C   Other Instructions - n/a

## 2019-07-11 NOTE — Progress Notes (Signed)
Follow-up Outpatient Visit Date: 07/11/2019  Primary Care Provider: Patient, No Pcp Per No address on file  Chief Complaint: Follow-up coronary artery disease  HPI:  Todd Reynolds is a 63 y.o. year-old male with history of coronary artery disease (NSTEMI in 08/2018 status post PCI to LCx), ischemic cardiomyopathy, and hyperlipidemia, who presents for follow-up of coronary artery disease.  We last spoke in the late April, at which time [name] was doing well without chest pain, shortness of breath, palpitations, and edema.  He was walking and biking regularly without any limitations.  LDL was mildly elevated at 78 despite high intensity statin therapy.  We agreed working on lifestyle modifications.  Repeat LDL in July had improved to 67.  Today, Todd Reynolds reports that he has been feeling well.  His only complaint if of inability to have an erection, which has been present ever since his MI in December.  He denies chest pain, shortness of breath, palpitations, lightheadedness, and edema.  He is tolerating his medications well.  He denies bleeding.  He continues to walk and bike most days.  Home BP's are typically less than 130/80.  --------------------------------------------------------------------------------------------------  Past Medical History:  Diagnosis Date  . CAD with hx of NSTEMI    a. 08/2018 NSTEMI/PCI: LM 20d, LAD 50p/d, D1 85ost, RI 90, LCX 126m/d - thrombotic (3.0x15 [place] DES), OM1 small/nl, OM2 mod/nl, OM3 large - fills via dLAD collats, RCA 40p/m, RPDA1 60, RPDA2 75, RPAV mod/nl. EF 45-50%.  . Hyperlipidemia LDL goal <70   . Ischemic cardiomyopathy    a. 08/2018 LV gram: EF 45-50%; b. 08/2018 Echo: EF 50-55%, mild inflat HK. Mild MR, mildly dil LA/RA.   Past Surgical History:  Procedure Laterality Date  . CORONARY STENT INTERVENTION N/A 08/28/2018   Procedure: CORONARY STENT INTERVENTION;  Surgeon: Nelva Bush, MD;  Location: [facility];  Service:  Cardiovascular;  Laterality: N/A;  . CORONARY THROMBECTOMY N/A 08/28/2018   Procedure: Coronary Thrombectomy;  Surgeon: Nelva Bush, MD;  Location: [facility];  Service: Cardiovascular;  Laterality: N/A;  . LEFT HEART CATH AND CORONARY ANGIOGRAPHY N/A 08/28/2018   Procedure: LEFT HEART CATH AND CORONARY ANGIOGRAPHY;  Surgeon: Nelva Bush, MD;  Location: [facility];  Service: Cardiovascular;  Laterality: N/A;    Current Meds  Medication Sig  . aspirin 81 MG EC tablet Take 1 tablet (81 mg total) by mouth daily.  Marland Kitchen atorvastatin (LIPITOR) 80 MG tablet Take 1 tablet (80 mg total) by mouth daily at 6 PM.  . clopidogrel (PLAVIX) 75 MG tablet Take 1 tablet (75 mg total) by mouth daily.  Marland Kitchen lisinopril (PRINIVIL,ZESTRIL) 10 MG tablet Take 1 tablet (10 mg total) by mouth daily.  . metFORMIN (GLUCOPHAGE) 1000 MG tablet Take 0.5 tablets (500 mg total) by mouth 2 (two) times daily with a meal.  . metoprolol tartrate (LOPRESSOR) 25 MG tablet TAKE 1/2 TABLET (12.5MG ) BY MOUTH 2 TIMES A [place]  . nitroGLYCERIN (NITROSTAT) 0.4 MG SL tablet Place 1 tablet (0.4 mg total) under the tongue every 5 (five) minutes as needed for chest pain.    Allergies: Patient has no known allergies.  Social History   Tobacco Use  . Smoking status: Never Smoker  . Smokeless tobacco: Never Used  Substance Use Topics  . Alcohol use: Never    Frequency: Never  . Drug use: Never    Family History  Problem Relation Age of Onset  . Hypertension Sister   . Heart attack  Sister   . Diabetes Sister   . Heart disease Brother     Review of Systems: A 12-system review of systems was performed and was negative except as noted in the HPI.  --------------------------------------------------------------------------------------------------  Physical Exam: BP 118/82 (BP Location: Left Arm, Patient Position: Sitting, Cuff Size: Normal)   Pulse 63   Ht 5\' 10"  (1.778 m)   Wt 190 lb 8 oz (86.4 kg)   SpO2  98%   BMI 27.33 kg/m   General:  NAD HEENT: No conjunctival pallor or scleral icterus. Facemask in place. Neck: Supple without lymphadenopathy, thyromegaly, JVD, or HJR. Lungs: Normal work of breathing. Clear to auscultation bilaterally without wheezes or crackles. Heart: Regular rate and rhythm without murmurs, rubs, or gallops. Non-displaced PMI. Abd: Bowel sounds present. Soft, NT/ND without hepatosplenomegaly Ext: No lower extremity edema. Radial, PT, and DP pulses are 2+ bilaterally. Skin: Warm and dry without rash.  EKG:  NSR without abnormality.  Lab Results  Component Value Date   WBC 5.8 03/27/2019   HGB 14.3 03/27/2019   HCT 43.1 03/27/2019   MCV 89 03/27/2019   PLT 214 03/27/2019    Lab Results  Component Value Date   NA 145 (H) 10/22/2018   K 4.8 10/22/2018   CL 104 10/22/2018   CO2 23 10/22/2018   BUN 12 10/22/2018   CREATININE 1.10 10/22/2018   GLUCOSE 100 (H) 10/22/2018   ALT 31 03/27/2019    Lab Results  Component Value Date   CHOL 115 03/27/2019   HDL 29 (L) 03/27/2019   LDLCALC 67 03/27/2019   TRIG 97 03/27/2019   CHOLHDL 4.0 03/27/2019    --------------------------------------------------------------------------------------------------  ASSESSMENT AND PLAN: Coronary artery disease: [name] continues to do well without angina.  We will continue his current medications for secondary prevention.  We have discussed the risks and benefits of long-term DAPT and have agreed to continue aspirin and clopidogrel beyond 12 months from his NSTEMI in 08/2018.  Hypertension: Diastolic BP mildly elevated but blood pressure readings have otherwise been good.  No medication changes today.  Hyperlipidemia: LDL at goal on most recent check.  Continue current medications.  Erectile dysfunction: [name] reports inability to have any sort of an erection, which began following his MI in December.  It is possible that this is due to medication side effects or  underlying vascular disease.  We have agreed to to a trial of sildenafil 20 mg daily as needed for erectile dysfunction.  He has never needed to use nitroglycerin.  I counseled him not to use any nitrates within 24 hours of having taken sildenafil.  She he have chest pain with sildenafil, he should call 911.  Follow-up: Return to clinic in 6 months.  January, MD 07/11/2019 11:03 AM

## 2019-09-15 ENCOUNTER — Other Ambulatory Visit: Payer: Self-pay

## 2019-09-15 MED ORDER — LISINOPRIL 10 MG PO TABS: 10.0000 mg | ORAL_TABLET | Freq: Every day | ORAL | 3 refills | Status: DC

## 2019-09-18 ENCOUNTER — Other Ambulatory Visit: Payer: Self-pay

## 2019-09-18 MED ORDER — CLOPIDOGREL BISULFATE 75 MG PO TABS: 75.0000 mg | ORAL_TABLET | Freq: Every day | ORAL | 0 refills | Status: DC

## 2019-09-18 NOTE — Telephone Encounter (Signed)
Requested Prescriptions   Signed Prescriptions Disp Refills  . clopidogrel (PLAVIX) 75 MG tablet 90 tablet 0    Sig: Take 1 tablet (75 mg total) by mouth daily.    Authorizing Provider: END, CHRISTOPHER    Ordering User: Casimer Lanius

## 2019-10-03 ENCOUNTER — Other Ambulatory Visit: Payer: Self-pay

## 2019-10-03 MED ORDER — METOPROLOL TARTRATE 25 MG PO TABS: ORAL_TABLET | ORAL | 2 refills | Status: DC

## 2019-12-05 ENCOUNTER — Other Ambulatory Visit: Payer: Self-pay

## 2019-12-05 MED ORDER — ATORVASTATIN CALCIUM 80 MG PO TABS: 80.0000 mg | ORAL_TABLET | Freq: Every day | ORAL | 3 refills | Status: DC

## 2019-12-19 ENCOUNTER — Other Ambulatory Visit: Payer: Self-pay

## 2019-12-19 ENCOUNTER — Other Ambulatory Visit: Payer: Self-pay | Admitting: Internal Medicine

## 2019-12-19 MED ORDER — LISINOPRIL 10 MG PO TABS: 10.0000 mg | ORAL_TABLET | Freq: Every day | ORAL | 0 refills | Status: DC

## 2020-01-07 ENCOUNTER — Ambulatory Visit: Payer: Medicaid Other | Admitting: Internal Medicine

## 2020-01-07 ENCOUNTER — Other Ambulatory Visit: Payer: Self-pay

## 2020-01-07 ENCOUNTER — Encounter: Payer: Self-pay | Admitting: Internal Medicine

## 2020-01-07 ENCOUNTER — Encounter (INDEPENDENT_AMBULATORY_CARE_PROVIDER_SITE_OTHER): Payer: Self-pay

## 2020-01-07 VITALS — BP 114/62 | HR 70 | Ht 70.0 in | Wt 194.0 lb

## 2020-01-07 DIAGNOSIS — E785 Hyperlipidemia, unspecified: Secondary | ICD-10-CM

## 2020-01-07 DIAGNOSIS — N529 Male erectile dysfunction, unspecified: Secondary | ICD-10-CM | POA: Diagnosis not present

## 2020-01-07 DIAGNOSIS — I2510 Atherosclerotic heart disease of native coronary artery without angina pectoris: Secondary | ICD-10-CM

## 2020-01-07 DIAGNOSIS — I255 Ischemic cardiomyopathy: Secondary | ICD-10-CM | POA: Diagnosis not present

## 2020-01-07 MED ORDER — CLOPIDOGREL BISULFATE 75 MG PO TABS: 75.0000 mg | ORAL_TABLET | Freq: Every day | ORAL | 2 refills | Status: DC

## 2020-01-07 MED ORDER — SILDENAFIL CITRATE 20 MG PO TABS: ORAL_TABLET | ORAL | 1 refills | Status: DC

## 2020-01-07 MED ORDER — LISINOPRIL 10 MG PO TABS: 10.0000 mg | ORAL_TABLET | Freq: Every day | ORAL | 2 refills | Status: DC

## 2020-01-07 NOTE — Patient Instructions (Addendum)
Medication Instructions:  Your physician recommends that you continue on your current medications as directed. Please refer to the Current Medication list given to you today.  *If you need a refill on your cardiac medications before your next appointment, please call your pharmacy*   Lab Work: Your physician recommends that you return for lab work in: AT [facility] In the next week if possible.  - LIPID, CMP, CBC.  - You will need to be fasting. Please do not have anything to eat or drink after midnight the morning you have the lab work. You may only have water or black coffee with no cream or sugar. - Please go to the [facility]. You will check in at the front desk to the right as you walk into the atrium. Valet Parking is offered if needed. - No appointment needed. You may go any [place] between 7 am and 6 pm.  If you have labs (blood work) drawn today and your tests are completely normal, you will receive your results only by: Marland Kitchen MyChart Message (if you have MyChart) OR . A paper copy in the mail If you have any lab test that is abnormal or we need to change your treatment, we will call you to review the results.   Testing/Procedures: none   Follow-Up: At [facility], you and your health needs are our priority.  As part of our continuing mission to provide you with exceptional heart care, we have created designated Provider Care Teams.  These Care Teams include your primary Cardiologist (physician) and Advanced Practice Providers (APPs -  Physician Assistants and Nurse Practitioners) who all work together to provide you with the care you need, when you need it.  We recommend signing up for the patient portal called "MyChart".  Sign up information is provided on this After Visit Summary.  MyChart is used to connect with patients for Virtual Visits (Telemedicine).  Patients are able to view lab/test results, encounter notes, upcoming appointments, etc.  Non-urgent  messages can be sent to your provider as well.   To learn more about what you can do with MyChart, go to [email or web address].    Your next appointment:   6 month(s)  The format for your next appointment:   In Person  Provider:    You may see Yvonne Kendall, MD or one of the following Advanced Practice Providers on your designated Care Team:    Nicolasa Ducking, NP  Eula Listen, PA-C  Marisue Ivan, PA-C

## 2020-01-07 NOTE — Progress Notes (Signed)
Follow-up Outpatient Visit Date: 01/07/2020  Primary Care Provider: Patient, No Pcp Per No address on file  Chief Complaint: Follow-up coronary artery disease  HPI:  Mr. Dimond is a 64 y.o. male with history of coronary artery disease (NSTEMI in 08/2018 status post PCI to LCx), ischemic cardiomyopathy, and hyperlipidemia, who presents for follow-up of coronary artery disease.  I last saw him in October, at which time Mr. Axon was feeling well.  His only complaint was of erectile dysfunction.  We agreed to a trial of sildenafil, though Mr. Mucci was not able to procure this from the [facility] clinic. He asked that a new prescription be sent to [facility].  My heart standpoint, Mr. Chopp reports he has been doing very well. He denies chest pain, shortness of breath, palpitations, lightheadedness, and edema. He has been rationing his clopidogrel and lisinopril because he was close to running out.  --------------------------------------------------------------------------------------------------  Past Medical History:  Diagnosis Date  . CAD with hx of NSTEMI    a. 08/2018 NSTEMI/PCI: LM 20d, LAD 50p/d, D1 85ost, RI 90, LCX 174m/d - thrombotic (3.0x15 [place] DES), OM1 small/nl, OM2 mod/nl, OM3 large - fills via dLAD collats, RCA 40p/m, RPDA1 60, RPDA2 75, RPAV mod/nl. EF 45-50%.  . Hyperlipidemia LDL goal <70   . Ischemic cardiomyopathy    a. 08/2018 LV gram: EF 45-50%; b. 08/2018 Echo: EF 50-55%, mild inflat HK. Mild MR, mildly dil LA/RA.   Past Surgical History:  Procedure Laterality Date  . CORONARY STENT INTERVENTION N/A 08/28/2018   Procedure: CORONARY STENT INTERVENTION;  Surgeon: Yvonne Kendall, MD;  Location: [facility];  Service: Cardiovascular;  Laterality: N/A;  . CORONARY THROMBECTOMY N/A 08/28/2018   Procedure: Coronary Thrombectomy;  Surgeon: Yvonne Kendall, MD;  Location: [facility];  Service: Cardiovascular;  Laterality: N/A;  . LEFT HEART CATH AND CORONARY  ANGIOGRAPHY N/A 08/28/2018   Procedure: LEFT HEART CATH AND CORONARY ANGIOGRAPHY;  Surgeon: Yvonne Kendall, MD;  Location: [facility];  Service: Cardiovascular;  Laterality: N/A;    Current Meds  Medication Sig  . aspirin 81 MG EC tablet Take 1 tablet (81 mg total) by mouth daily.  Marland Kitchen atorvastatin (LIPITOR) 80 MG tablet Take 1 tablet (80 mg total) by mouth daily at 6 PM.  . clopidogrel (PLAVIX) 75 MG tablet Take 1 tablet (75 mg total) by mouth daily. Please schedule office visit for further refills. Thank you!  Marland Kitchen lisinopril (ZESTRIL) 10 MG tablet Take 1 tablet (10 mg total) by mouth daily. Please schedule office visit for further refills. Thank you!  . metFORMIN (GLUCOPHAGE) 1000 MG tablet Take 0.5 tablets (500 mg total) by mouth 2 (two) times daily with a meal.  . metoprolol tartrate (LOPRESSOR) 25 MG tablet TAKE 1/2 TABLET (12.5MG ) BY MOUTH 2 TIMES A [place]  . nitroGLYCERIN (NITROSTAT) 0.4 MG SL tablet Place 1 tablet (0.4 mg total) under the tongue every 5 (five) minutes as needed for chest pain.  . sildenafil (REVATIO) 20 MG tablet Take 1 tablet (20 mg) by mouth once daily as needed for erectile dysfunction    Allergies: Patient has no known allergies.  Social History   Tobacco Use  . Smoking status: Never Smoker  . Smokeless tobacco: Never Used  Substance Use Topics  . Alcohol use: Never  . Drug use: Never    Family History  Problem Relation Age of Onset  . Hypertension Sister   . Heart attack Sister   . Diabetes Sister   .  Heart disease Brother     Review of Systems: A 12-system review of systems was performed and was negative except as noted in the HPI.  --------------------------------------------------------------------------------------------------  Physical Exam: BP 114/62 (BP Location: Left Arm, Patient Position: Sitting, Cuff Size: Large)   Pulse 70   Ht 5\' 10"  (1.778 m)   Wt 194 lb (88 kg)   SpO2 97%   BMI 27.84 kg/m   General: NAD. Neck: No JVD  or HJR. Lungs: Clear to auscultation bilaterally without wheezes or crackles. Heart: Regular rate and rhythm without murmurs, rubs, or gallops. Abdomen: Soft, nontender, nondistended. Extremities: Trace pretibial edema bilaterally.  EKG: Normal sinus rhythm without abnormality.  Lab Results  Component Value Date   WBC 5.8 03/27/2019   HGB 14.3 03/27/2019   HCT 43.1 03/27/2019   MCV 89 03/27/2019   PLT 214 03/27/2019    Lab Results  Component Value Date   NA 145 (H) 10/22/2018   K 4.8 10/22/2018   CL 104 10/22/2018   CO2 23 10/22/2018   BUN 12 10/22/2018   CREATININE 1.10 10/22/2018   GLUCOSE 100 (H) 10/22/2018   ALT 31 03/27/2019    Lab Results  Component Value Date   CHOL 115 03/27/2019   HDL 29 (L) 03/27/2019   LDLCALC 67 03/27/2019   TRIG 97 03/27/2019   CHOLHDL 4.0 03/27/2019    --------------------------------------------------------------------------------------------------  ASSESSMENT AND PLAN: Coronary artery disease: Mr. Mario does not report any symptoms disease worsening coronary insufficiency. Given his multivessel CAD status post PCI to occluded LCx in 2019, we will plan to continue indefinite dual antiplatelet therapy with aspirin and clopidogrel. New prescription for clopidogrel was sent in today. We will check a CBC to ensure normal hemoglobin and platelets with next lab draw.  Ischemic cardiomyopathy: Mr. Cornelio appears euvolemic and well compensated on examination today. We will plan to continue goal-directed medical therapy with metoprolol and lisinopril at current doses.  Hyperlipidemia: LDL at goal on most recent check last year. We will continue with atorvastatin 80 mg daily and plan to repeat a fasting lipid panel and CMP at Mr. Days convenience in the next month or 2.  Erectile dysfunction: This continues to be a problem for Mr. Coppock. Unfortunately, he was unable to get the prescription for sildenafil filled. We will send a new prescription to  [facility]. He has previously been counseled on the importance of avoiding concurrent sildenafil and nitrate use.  Follow-up: Return to clinic in 6 months.  Nelva Bush, MD 01/07/2020 11:08 AM

## 2020-01-20 IMAGING — CR DG CHEST 2V
2 series · 2 of 2 positions shown · non-contrast
Comparison: None.

CLINICAL DATA: Chest pain

EXAM:
CHEST - 2 VIEW

[chest pa]
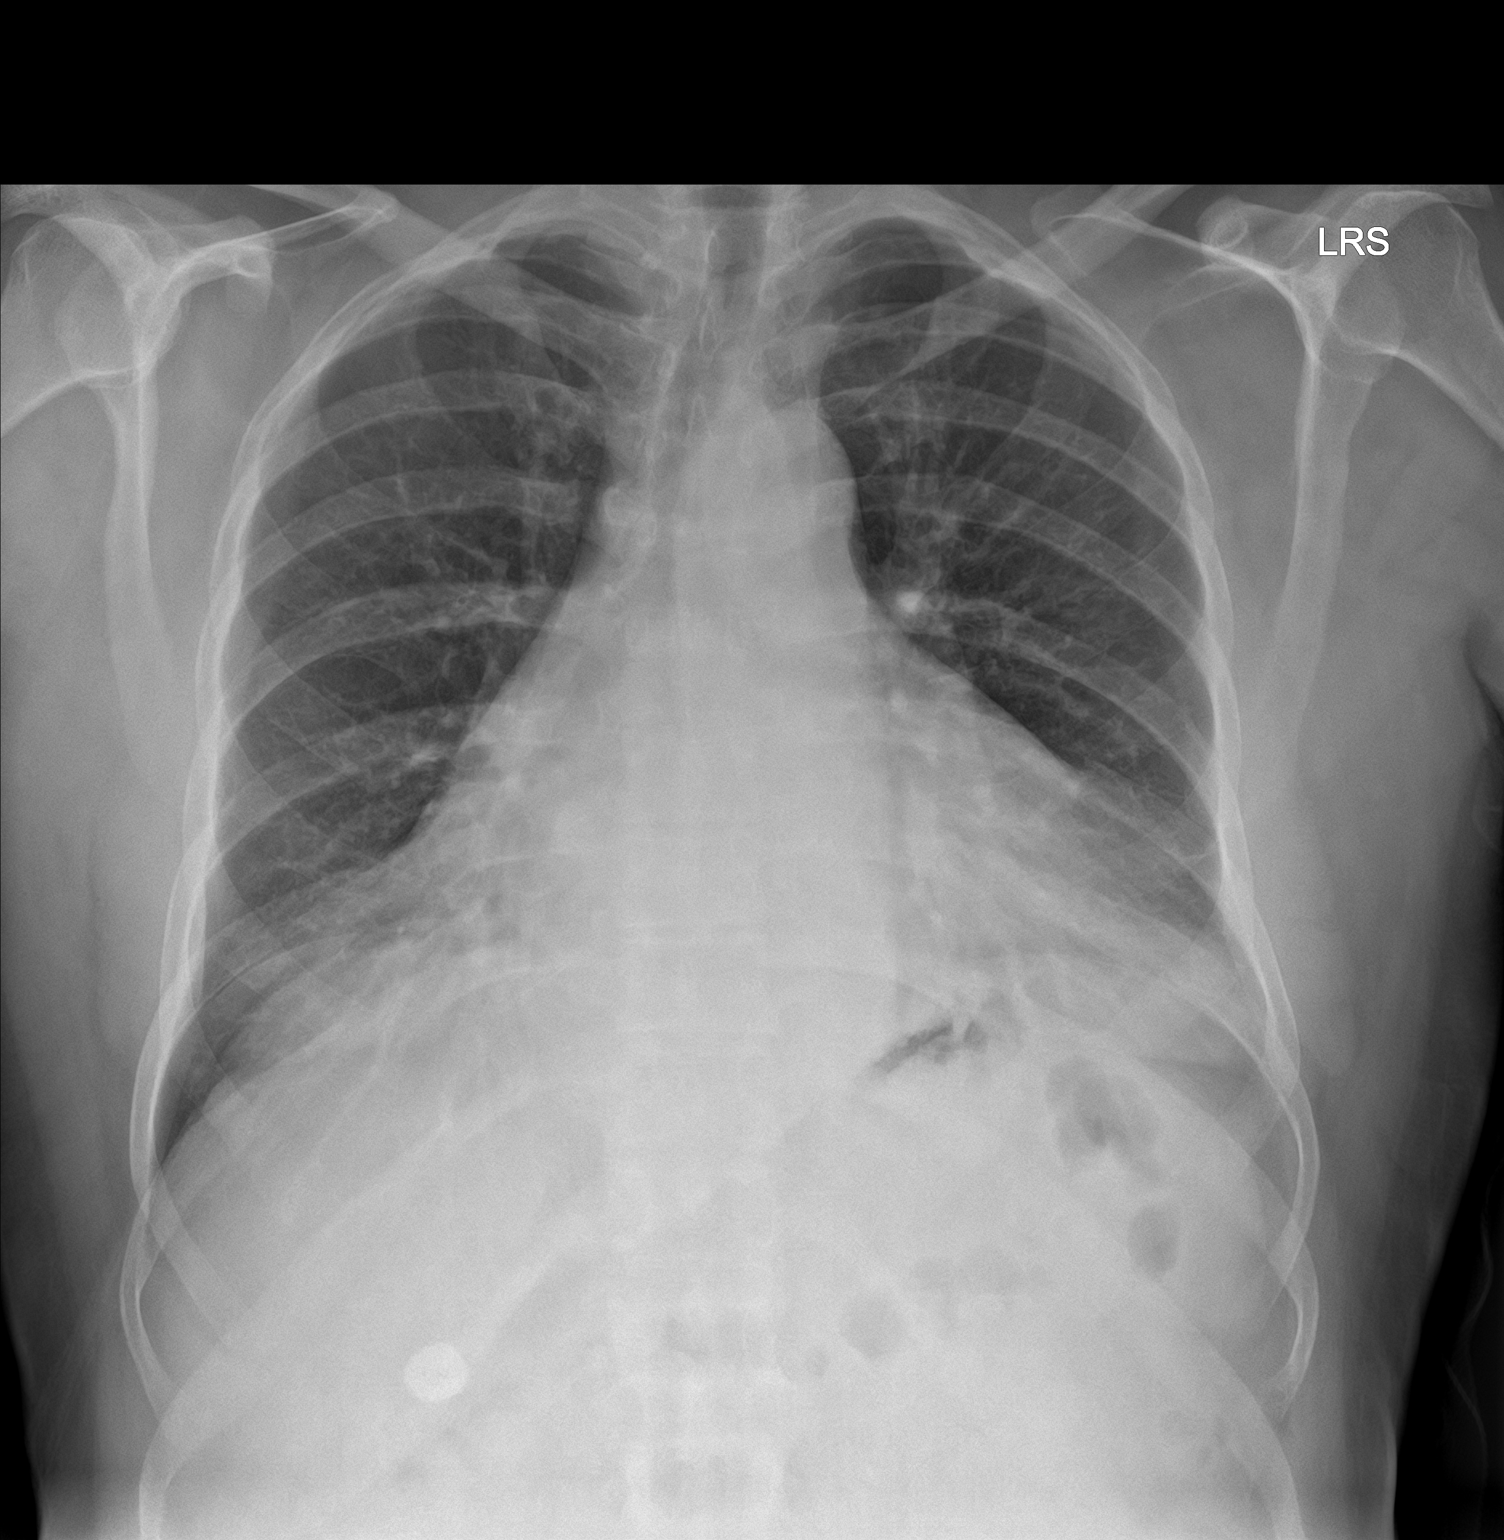

[chest lat]
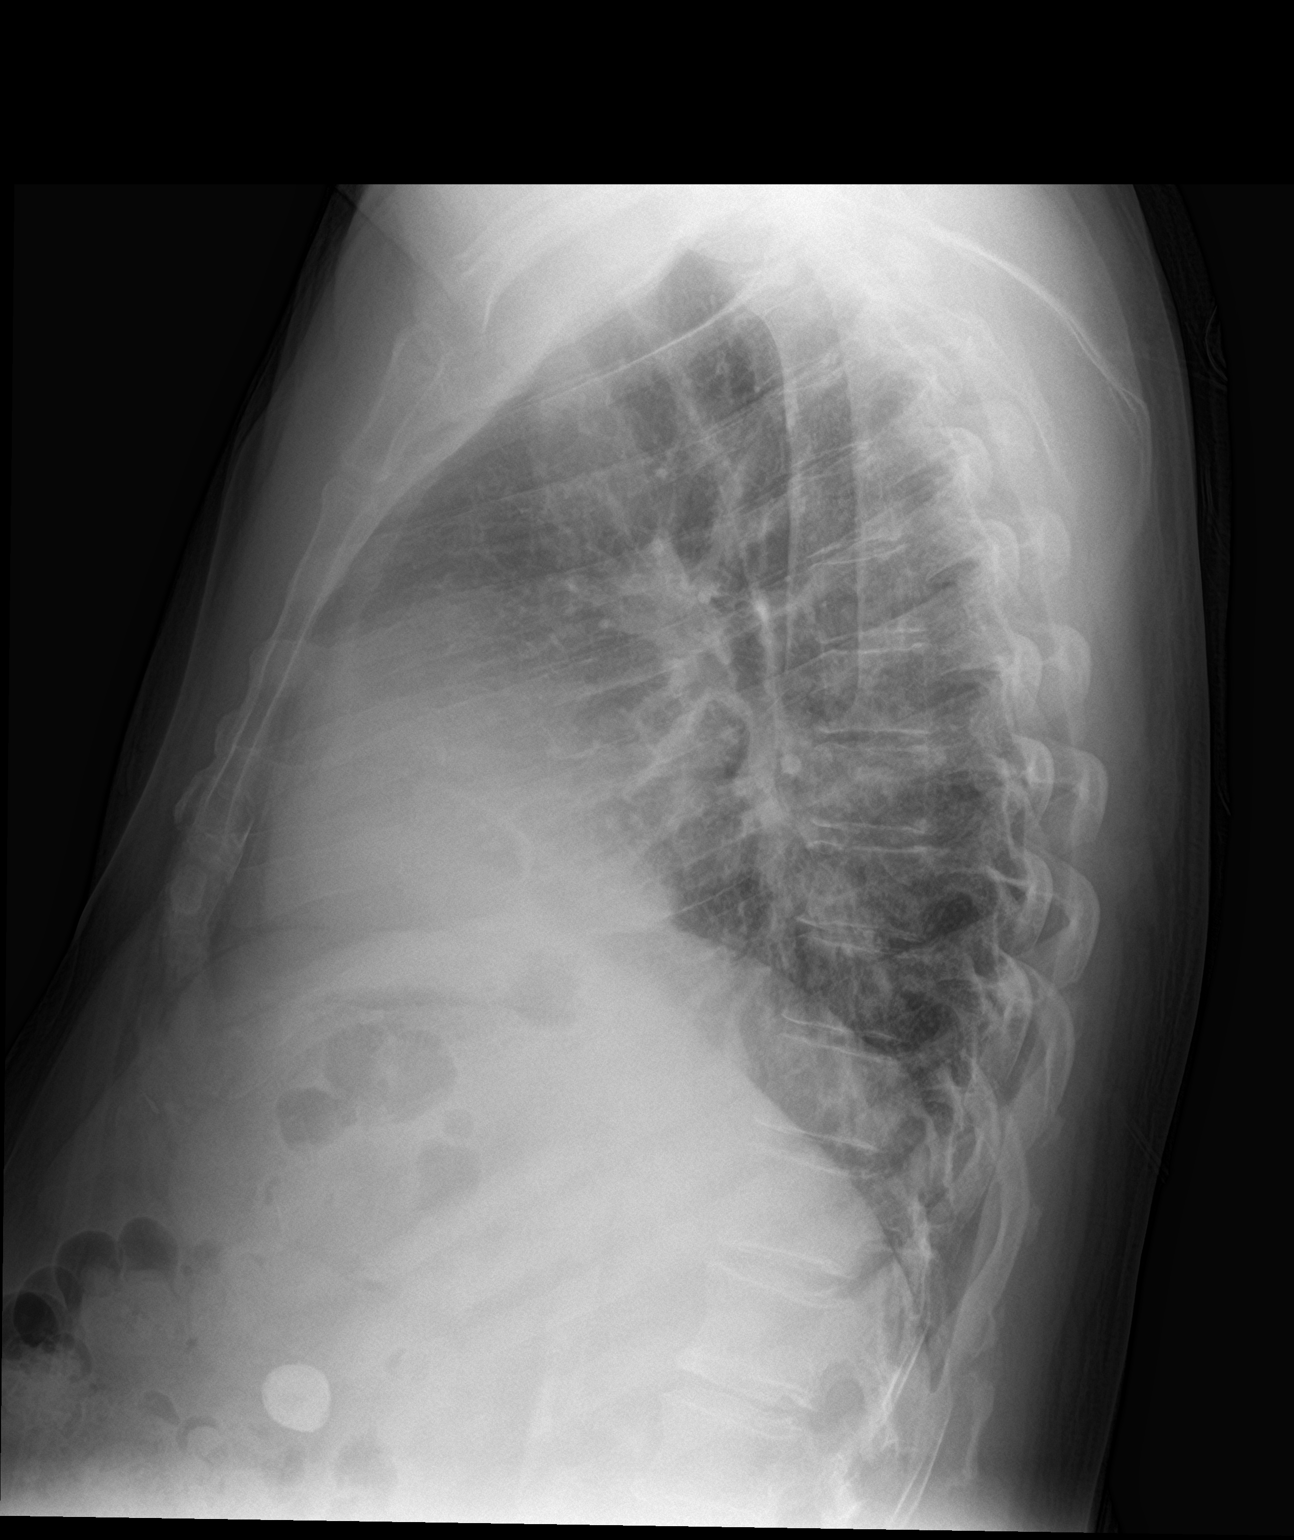

[2 of 2 positions shown; findings below may reference images not displayed]

FINDINGS: There is no edema or consolidation. Heart is slightly enlarged with
pulmonary vascularity normal. No adenopathy. No bone lesions. No
pneumothorax.
IMPRESSION: Mild cardiac prominence.  No edema or consolidation.

## 2020-02-10 ENCOUNTER — Other Ambulatory Visit: Payer: Self-pay | Admitting: Internal Medicine

## 2020-03-14 ENCOUNTER — Emergency Department
Admission: EM | Admit: 2020-03-14 | Discharge: 2020-03-14 | Disposition: A | Discharge status: Home / Self Care | Payer: Medicaid Other | Attending: Emergency Medicine | Admitting: Emergency Medicine

## 2020-03-14 ENCOUNTER — Other Ambulatory Visit: Payer: Self-pay

## 2020-03-14 DIAGNOSIS — Z7984 Long term (current) use of oral hypoglycemic drugs: Secondary | ICD-10-CM | POA: Diagnosis not present

## 2020-03-14 DIAGNOSIS — W57XXXA Bitten or stung by nonvenomous insect and other nonvenomous arthropods, initial encounter: Secondary | ICD-10-CM | POA: Insufficient documentation

## 2020-03-14 DIAGNOSIS — Z7982 Long term (current) use of aspirin: Secondary | ICD-10-CM | POA: Insufficient documentation

## 2020-03-14 DIAGNOSIS — S30860A Insect bite (nonvenomous) of lower back and pelvis, initial encounter: Secondary | ICD-10-CM | POA: Diagnosis not present

## 2020-03-14 DIAGNOSIS — Z955 Presence of coronary angioplasty implant and graft: Secondary | ICD-10-CM | POA: Insufficient documentation

## 2020-03-14 DIAGNOSIS — I2510 Atherosclerotic heart disease of native coronary artery without angina pectoris: Secondary | ICD-10-CM | POA: Insufficient documentation

## 2020-03-14 DIAGNOSIS — Y999 Unspecified external cause status: Secondary | ICD-10-CM | POA: Insufficient documentation

## 2020-03-14 DIAGNOSIS — Y9389 Activity, other specified: Secondary | ICD-10-CM | POA: Diagnosis not present

## 2020-03-14 DIAGNOSIS — Z79899 Other long term (current) drug therapy: Secondary | ICD-10-CM | POA: Insufficient documentation

## 2020-03-14 DIAGNOSIS — I10 Essential (primary) hypertension: Secondary | ICD-10-CM | POA: Insufficient documentation

## 2020-03-14 DIAGNOSIS — T63441A Toxic effect of venom of bees, accidental (unintentional), initial encounter: Secondary | ICD-10-CM

## 2020-03-14 DIAGNOSIS — Y92009 Unspecified place in unspecified non-institutional (private) residence as the place of occurrence of the external cause: Secondary | ICD-10-CM | POA: Insufficient documentation

## 2020-03-14 MED ORDER — TRIAMCINOLONE ACETONIDE 0.5 % EX OINT: 1.0000 "application " | TOPICAL_OINTMENT | Freq: Two times a day (BID) | CUTANEOUS | 0 refills | Status: AC

## 2020-03-14 MED ORDER — PREDNISONE 20 MG PO TABS
60.0000 mg | ORAL_TABLET | Freq: Once | ORAL | Status: AC
  Administered 2020-03-14: 60 mg via ORAL
  Filled 2020-03-14: qty 3

## 2020-03-14 NOTE — ED Provider Notes (Signed)
[facility] Emergency Department Provider Note  ____________________________________________  Time seen: Approximately 11:43 AM  I have reviewed the triage vital signs and the nursing notes.   HISTORY  Chief Complaint Bee sting    HPI Todd Reynolds is a 64 y.o. male that presents to the emergency department for evaluation of after bee sting.  Patient was stung by 2 "yellow bees" this morning.  Patient is unaware of any allergy to bees.  Area itches.  He has taken Benadryl.  No throat pain or swelling, shortness of breath.   Past Medical History:  Diagnosis Date  . CAD with hx of NSTEMI    a. 08/2018 NSTEMI/PCI: LM 20d, LAD 50p/d, D1 85ost, RI 90, LCX 159m/d - thrombotic (3.0x15 [place] DES), OM1 small/nl, OM2 mod/nl, OM3 large - fills via dLAD collats, RCA 40p/m, RPDA1 60, RPDA2 75, RPAV mod/nl. EF 45-50%.  . Hyperlipidemia LDL goal <70   . Ischemic cardiomyopathy    a. 08/2018 LV gram: EF 45-50%; b. 08/2018 Echo: EF 50-55%, mild inflat HK. Mild MR, mildly dil LA/RA.    Patient Active Problem List   Diagnosis Date Noted  . Erectile dysfunction 07/11/2019  . Essential hypertension 04/02/2019  . Prediabetes 04/02/2019  . Coronary artery disease involving native coronary artery of native heart without angina pectoris 01/06/2019  . Ischemic cardiomyopathy 01/06/2019  . Hyperlipidemia LDL goal <70 01/06/2019    Past Surgical History:  Procedure Laterality Date  . CORONARY STENT INTERVENTION N/A 08/28/2018   Procedure: CORONARY STENT INTERVENTION;  Surgeon: Nelva Bush, MD;  Location: [place] [facility];  Service: Cardiovascular;  Laterality: N/A;  . CORONARY THROMBECTOMY N/A 08/28/2018   Procedure: Coronary Thrombectomy;  Surgeon: Nelva Bush, MD;  Location: [place] [facility];  Service: Cardiovascular;  Laterality: N/A;  . LEFT HEART CATH AND CORONARY ANGIOGRAPHY N/A 08/28/2018   Procedure: LEFT HEART CATH AND CORONARY ANGIOGRAPHY;  Surgeon:  Nelva Bush, MD;  Location: [place] [facility];  Service: Cardiovascular;  Laterality: N/A;    Prior to Admission medications   Medication Sig Start Date End Date Taking? Authorizing Provider  aspirin 81 MG EC tablet Take 1 tablet (81 mg total) by mouth daily. 02/19/19   Iloabachie, Chioma E, NP  atorvastatin (LIPITOR) 80 MG tablet Take 1 tablet (80 mg total) by mouth daily at 6 PM. 12/05/19   End, Harrell Gave, MD  clopidogrel (PLAVIX) 75 MG tablet Take 1 tablet (75 mg total) by mouth daily. 01/07/20   End, Harrell Gave, MD  lisinopril (ZESTRIL) 10 MG tablet Take 1 tablet (10 mg total) by mouth daily. 01/07/20   End, Harrell Gave, MD  metFORMIN (GLUCOPHAGE) 1000 MG tablet Take 0.5 tablets (500 mg total) by mouth 2 (two) times daily with a meal. 04/02/19   Iloabachie, Chioma E, NP  metoprolol tartrate (LOPRESSOR) 25 MG tablet Take 1/2 (one-half) tablet by mouth twice daily 02/10/20   End, Harrell Gave, MD  nitroGLYCERIN (NITROSTAT) 0.4 MG SL tablet Place 1 tablet (0.4 mg total) under the tongue every 5 (five) minutes as needed for chest pain. 09/12/18   Iloabachie, Chioma E, NP  sildenafil (REVATIO) 20 MG tablet Take 1 tablet (20 mg) by mouth once daily as needed for erectile dysfunction. Contact PCP for further refills. 01/07/20   End, Harrell Gave, MD  triamcinolone ointment (KENALOG) 0.5 % Apply 1 application topically 2 (two) times daily. 03/14/20   Laban Emperor, PA-C    Allergies Patient has no known allergies.  Family History  Problem Relation Age of Onset  .  Hypertension Sister   . Heart attack Sister   . Diabetes Sister   . Heart disease Brother     Social History Social History   Tobacco Use  . Smoking status: Never Smoker  . Smokeless tobacco: Never Used  Vaping Use  . Vaping Use: Never used  Substance Use Topics  . Alcohol use: Never  . Drug use: Never     Review of Systems  Constitutional: No fever/chills ENT: No upper respiratory complaints. Cardiovascular: No  chest pain. Respiratory: No cough.  No SOB. Gastrointestinal: No abdominal pain.  No nausea, no vomiting.  Musculoskeletal: Negative for musculoskeletal pain. Skin: Negative for abrasions, lacerations, ecchymosis.  Positive for rash. Neurological: Negative for headaches, numbness or tingling   ____________________________________________   PHYSICAL EXAM:  VITAL SIGNS: ED Triage Vitals  Enc Vitals Group     BP 03/14/20 1109 122/82     Pulse Rate 03/14/20 1109 72     Resp 03/14/20 1109 18     Temp 03/14/20 1109 98.8 F (37.1 C)     Temp src --      SpO2 03/14/20 1109 96 %     Weight 03/14/20 1110 180 lb (81.6 kg)     Height 03/14/20 1110 5\' 10"  (1.778 m)     Head Circumference --      Peak Flow --      Pain Score 03/14/20 1110 0     Pain Loc --      Pain Edu? --      Excl. in GC? --      Constitutional: Alert and oriented. Well appearing and in no acute distress. Eyes: Conjunctivae are normal. PERRL. EOMI. Head: Atraumatic. ENT:      Ears:      Nose: No congestion/rhinnorhea.      Mouth/Throat: Mucous membranes are moist.  Neck: No stridor.  Cardiovascular: Normal rate, regular rhythm.  Good peripheral circulation. Respiratory: Normal respiratory effort without tachypnea or retractions. Lungs CTAB. Good air entry to the bases with no decreased or absent breath sounds. Musculoskeletal: Full range of motion to all extremities. No gross deformities appreciated. Neurologic:  Normal speech and language. No gross focal neurologic deficits are appreciated.  Skin:  Skin is warm, dry and intact.  1 inch x 1 inch and 2 inch x 1 inch areas of erythema to left side. Psychiatric: Mood and affect are normal. Speech and behavior are normal. Patient exhibits appropriate insight and judgement.   ____________________________________________   LABS (all labs ordered are listed, but only abnormal results are displayed)  Labs Reviewed - No data to  display ____________________________________________  EKG   ____________________________________________  RADIOLOGY   No results found.  ____________________________________________    PROCEDURES  Procedure(s) performed:    Procedures    Medications  predniSONE (DELTASONE) tablet 60 mg (60 mg Oral Given 03/14/20 1155)     ____________________________________________   INITIAL IMPRESSION / ASSESSMENT AND PLAN / ED COURSE  Pertinent labs & imaging results that were available during my care of the patient were reviewed by me and considered in my medical decision making (see chart for details).  Review of the [place] CSRS was performed in accordance of the [facility] prior to dispensing any controlled drugs.   Patient's diagnosis is consistent with bee sting. Patient was given one dose of prednisone in the emergency department. Patient will be discharged home with prescriptions for benadryl and triamcinalone. Patient is to follow up with PCP as directed. Patient is given ED precautions to  return to the ED for any worsening or new symptoms.  Todd Reynolds was evaluated in Emergency Department on 03/14/2020 for the symptoms described in the history of present illness. He was evaluated in the context of the global COVID-19 pandemic, which necessitated consideration that the patient might be at risk for infection with the SARS-CoV-2 virus that causes COVID-19. Institutional protocols and algorithms that pertain to the evaluation of patients at risk for COVID-19 are in a state of rapid change based on information released by regulatory bodies including the CDC and federal and state organizations. These policies and algorithms were followed during the patient's care in the ED.   ____________________________________________  FINAL CLINICAL IMPRESSION(S) / ED DIAGNOSES  Final diagnoses:  Bee sting, accidental or unintentional, initial encounter      NEW MEDICATIONS STARTED DURING THIS  VISIT:  ED Discharge Orders         Ordered    triamcinolone ointment (KENALOG) 0.5 %  2 times daily     Discontinue  Reprint     03/14/20 1205              This chart was dictated using voice recognition software/Dragon. Despite best efforts to proofread, errors can occur which can change the meaning. Any change was purely unintentional.    Enid Derry, PA-C 03/14/20 1814    Sharman Cheek, MD 03/15/20 [phone]

## 2020-03-14 NOTE — Discharge Instructions (Signed)
Continue taking benadryl today.

## 2020-03-14 NOTE — ED Notes (Signed)
Pt presents to the ED for bee sting on the R side near his underarm. Pt states itching but denies SOB. Redness noted to pt side and underarm.

## 2020-03-14 NOTE — ED Triage Notes (Signed)
Pt comes via POV from home with c/o bee sting x@ to right back area. Pt states he was working outside when he got stung. Pt denies any SOB

## 2020-04-05 ENCOUNTER — Other Ambulatory Visit: Payer: Self-pay | Admitting: Internal Medicine

## 2020-06-09 ENCOUNTER — Other Ambulatory Visit: Payer: Self-pay

## 2020-06-09 ENCOUNTER — Emergency Department
Admission: EM | Admit: 2020-06-09 | Discharge: 2020-06-09 | Disposition: A | Discharge status: Home / Self Care | Payer: Medicaid Other | Attending: Emergency Medicine | Admitting: Emergency Medicine

## 2020-06-09 DIAGNOSIS — Z79899 Other long term (current) drug therapy: Secondary | ICD-10-CM | POA: Diagnosis not present

## 2020-06-09 DIAGNOSIS — Z7982 Long term (current) use of aspirin: Secondary | ICD-10-CM | POA: Insufficient documentation

## 2020-06-09 DIAGNOSIS — I10 Essential (primary) hypertension: Secondary | ICD-10-CM | POA: Diagnosis not present

## 2020-06-09 DIAGNOSIS — I2510 Atherosclerotic heart disease of native coronary artery without angina pectoris: Secondary | ICD-10-CM | POA: Diagnosis not present

## 2020-06-09 DIAGNOSIS — Z955 Presence of coronary angioplasty implant and graft: Secondary | ICD-10-CM | POA: Insufficient documentation

## 2020-06-09 DIAGNOSIS — R21 Rash and other nonspecific skin eruption: Secondary | ICD-10-CM | POA: Diagnosis present

## 2020-06-09 DIAGNOSIS — N3000 Acute cystitis without hematuria: Secondary | ICD-10-CM | POA: Insufficient documentation

## 2020-06-09 LAB — URINALYSIS, COMPLETE (UACMP) WITH MICROSCOPIC
Bacteria, UA: NONE SEEN
Bilirubin Urine: NEGATIVE
Glucose, UA: NEGATIVE mg/dL
Hgb urine dipstick: NEGATIVE
Ketones, ur: NEGATIVE mg/dL
Nitrite: NEGATIVE
Protein, ur: NEGATIVE mg/dL
Specific Gravity, Urine: 1.017 (ref 1.005–1.030)
Squamous Epithelial / HPF: NONE SEEN (ref 0–5)
WBC, UA: >50 WBC/hpf — ABNORMAL HIGH (ref 0–5)
pH: 6 (ref 5.0–8.0)

## 2020-06-09 LAB — CHLAMYDIA/NGC RT PCR (ARMC ONLY)
Chlamydia Tr: NOT DETECTED
N gonorrhoeae: DETECTED — AB

## 2020-06-09 MED ORDER — SULFAMETHOXAZOLE-TRIMETHOPRIM 800-160 MG PO TABS
1.0000 | ORAL_TABLET | Freq: Two times a day (BID) | ORAL | 0 refills | Status: AC
Start: 2020-06-09 — End: 2020-06-16

## 2020-06-09 NOTE — ED Provider Notes (Signed)
[facility] Emergency Department Provider Note  ____________________________________________   First MD Initiated Contact with Patient 06/09/20 1046     (approximate)  I have reviewed the triage vital signs and the nursing notes.   HISTORY  Chief Complaint Rash  HPI Todd Reynolds is a 64 y.o. male who presents to the emergency department for complaints of dysuria x 3 days, genital rash that began today.  The patient denies history of UTI, renal problems, prostate problems.  Patient denies concern for STI exposure.  He denies hematuria, pyuria, urethral discharge, flank pain, fever or other systemic symptoms.         Past Medical History:  Diagnosis Date  . CAD with hx of NSTEMI    a. 08/2018 NSTEMI/PCI: LM 20d, LAD 50p/d, D1 85ost, RI 90, LCX 15m/d - thrombotic (3.0x15 [place] DES), OM1 small/nl, OM2 mod/nl, OM3 large - fills via dLAD collats, RCA 40p/m, RPDA1 60, RPDA2 75, RPAV mod/nl. EF 45-50%.  . Hyperlipidemia LDL goal <70   . Ischemic cardiomyopathy    a. 08/2018 LV gram: EF 45-50%; b. 08/2018 Echo: EF 50-55%, mild inflat HK. Mild MR, mildly dil LA/RA.    Patient Active Problem List   Diagnosis Date Noted  . Erectile dysfunction 07/11/2019  . Essential hypertension 04/02/2019  . Prediabetes 04/02/2019  . Coronary artery disease involving native coronary artery of native heart without angina pectoris 01/06/2019  . Ischemic cardiomyopathy 01/06/2019  . Hyperlipidemia LDL goal <70 01/06/2019    Past Surgical History:  Procedure Laterality Date  . CORONARY STENT INTERVENTION N/A 08/28/2018   Procedure: CORONARY STENT INTERVENTION;  Surgeon: Yvonne Kendall, MD;  Location: [facility] INVASIVE CV LAB;  Service: Cardiovascular;  Laterality: N/A;  . CORONARY THROMBECTOMY N/A 08/28/2018   Procedure: Coronary Thrombectomy;  Surgeon: Yvonne Kendall, MD;  Location: [facility] INVASIVE CV LAB;  Service: Cardiovascular;  Laterality: N/A;  . LEFT HEART CATH AND  CORONARY ANGIOGRAPHY N/A 08/28/2018   Procedure: LEFT HEART CATH AND CORONARY ANGIOGRAPHY;  Surgeon: Yvonne Kendall, MD;  Location: [facility] INVASIVE CV LAB;  Service: Cardiovascular;  Laterality: N/A;    Prior to Admission medications   Medication Sig Start Date End Date Taking? Authorizing Provider  aspirin 81 MG EC tablet Take 1 tablet (81 mg total) by mouth daily. 02/19/19   Iloabachie, Chioma E, NP  atorvastatin (LIPITOR) 80 MG tablet Take 1 tablet (80 mg total) by mouth daily at 6 PM. 12/05/19   End, Cristal Deer, MD  clopidogrel (PLAVIX) 75 MG tablet Take 1 tablet by mouth once daily 04/05/20   End, Cristal Deer, MD  lisinopril (ZESTRIL) 10 MG tablet Take 1 tablet (10 mg total) by mouth daily. 01/07/20   End, Cristal Deer, MD  metFORMIN (GLUCOPHAGE) 1000 MG tablet Take 0.5 tablets (500 mg total) by mouth 2 (two) times daily with a meal. 04/02/19   Iloabachie, Chioma E, NP  metoprolol tartrate (LOPRESSOR) 25 MG tablet Take 1/2 (one-half) tablet by mouth twice daily 04/05/20   End, Cristal Deer, MD  nitroGLYCERIN (NITROSTAT) 0.4 MG SL tablet Place 1 tablet (0.4 mg total) under the tongue every 5 (five) minutes as needed for chest pain. 09/12/18   Iloabachie, Chioma E, NP  sildenafil (REVATIO) 20 MG tablet Take 1 tablet (20 mg) by mouth once daily as needed for erectile dysfunction. Contact PCP for further refills. 01/07/20   End, Cristal Deer, MD  sulfamethoxazole-trimethoprim (BACTRIM DS) 800-160 MG tablet Take 1 tablet by mouth 2 (two) times daily for 7 days. 06/09/20 06/16/20  Lucy Chris,  PA  triamcinolone ointment (KENALOG) 0.5 % Apply 1 application topically 2 (two) times daily. 03/14/20   Enid Derry, PA-C    Allergies Patient has no known allergies.  Family History  Problem Relation Age of Onset  . Hypertension Sister   . Heart attack Sister   . Diabetes Sister   . Heart disease Brother     Social History Social History   Tobacco Use  . Smoking status: Never Smoker  . Smokeless  tobacco: Never Used  Vaping Use  . Vaping Use: Never used  Substance Use Topics  . Alcohol use: Never  . Drug use: Never    Review of Systems Constitutional: No fever/chills Eyes: No visual changes. ENT: No sore throat. Cardiovascular: Denies chest pain. Respiratory: Denies shortness of breath. Gastrointestinal: No abdominal pain.  No nausea, no vomiting.  No diarrhea.  No constipation. Genitourinary: Positive for dysuria.  Negative for pyuria, hematuria, urethral discharge, flank pain. Musculoskeletal: Negative for back pain. Skin: Positive for genital rash. Neurological: Negative for headaches, focal weakness or numbness.  ____________________________________________   PHYSICAL EXAM:  VITAL SIGNS: ED Triage Vitals  Enc Vitals Group     BP 06/09/20 0911 (!) 127/94     Pulse Rate 06/09/20 0911 66     Resp 06/09/20 0911 16     Temp 06/09/20 0911 97.9 F (36.6 C)     Temp Source 06/09/20 0911 Oral     SpO2 06/09/20 0911 97 %     Weight 06/09/20 0912 180 lb (81.6 kg)     Height 06/09/20 0912 5\' 10"  (1.778 m)     Head Circumference --      Peak Flow --      Pain Score 06/09/20 0912 0     Pain Loc --      Pain Edu? --      Excl. in GC? --    Constitutional: Alert and oriented. Well appearing and in no acute distress. Eyes: Conjunctivae are normal. PERRL. EOMI. Head: Atraumatic. Neck: No stridor.   Cardiovascular: Normal rate, regular rhythm. Grossly normal heart sounds.  Good peripheral circulation. Respiratory: Normal respiratory effort.  No retractions. Lungs CTAB. Gastrointestinal: Soft and nontender. No distention. No abdominal bruits. No CVA tenderness. Genitourinary: Normal appearing external male genitalia without erythema, swelling or obvious rash noted. Neurologic:  Normal speech and language. No gross focal neurologic deficits are appreciated. No gait instability. Skin:  Skin is warm, dry and intact. No rash noted. Psychiatric: Mood and affect are normal.  Speech and behavior are normal.  ____________________________________________   LABS (all labs ordered are listed, but only abnormal results are displayed)  Labs Reviewed  URINALYSIS, COMPLETE (UACMP) WITH MICROSCOPIC - Abnormal; Notable for the following components:      Result Value   Color, Urine YELLOW (*)    APPearance HAZY (*)    Leukocytes,Ua LARGE (*)    WBC, UA >50 (*)    All other components within normal limits    ____________________________________________   INITIAL IMPRESSION / ASSESSMENT AND PLAN / ED COURSE  As part of my medical decision making, I reviewed the following data within the electronic MEDICAL RECORD NUMBER Nursing notes reviewed and incorporated        Alie Melgarejo is a 64 year old male who presents to emergency department with dysuria x3 days and perceived genital rash x1 [place].  The patient does not have any concerning systemic symptoms or flank pain or other urinary complaints.  Physical exam is also reassuring with no  CVA tenderness and a normal exam of external male genitalia. the patient's urinalysis shows a large number of leukocytes and many white blood cells without evident bacteria.  Given the lack of other associated symptoms and normal physical exam, this is likely to be acute cystitis but other differentials considered include STD, prostatitis, other urinary tract infection.  We will treat at this time with Bactrim on an outpatient basis and send urine for culture as well as GC chlamydia.  We will call the patient if culture or GC chlamydia raises any concerning findings.  Otherwise the patient should return to primary care or the ER for any worsening symptoms, particularly fever or flank pain.  The patient is amenable with outpatient therapy and is stable for discharge at this time.      ____________________________________________   FINAL CLINICAL IMPRESSION(S) / ED DIAGNOSES  Final diagnoses:  Acute cystitis without hematuria     ED  Discharge Orders         Ordered    sulfamethoxazole-trimethoprim (BACTRIM DS) 800-160 MG tablet  2 times daily        06/09/20 1057          *Please note:  Todd Reynolds was evaluated in Emergency Department on 06/09/2020 for the symptoms described in the history of present illness. He was evaluated in the context of the global COVID-19 pandemic, which necessitated consideration that the patient might be at risk for infection with the SARS-CoV-2 virus that causes COVID-19. Institutional protocols and algorithms that pertain to the evaluation of patients at risk for COVID-19 are in a state of rapid change based on information released by regulatory bodies including the CDC and federal and state organizations. These policies and algorithms were followed during the patient's care in the ED.  Some ED evaluations and interventions may be delayed as a result of limited staffing during and the pandemic.*   Note:  This document was prepared using Dragon voice recognition software and may include unintentional dictation errors.    Lucy Chris, PA 06/09/20 1112    Jene Every, MD 06/09/20 [phone]

## 2020-06-09 NOTE — ED Triage Notes (Signed)
Pt arrives via pov, ambulatory to triage. Pt reports rash on genitals, and burning sensation when urinating. Denies and discharge. NAD noted at this time.

## 2020-06-10 ENCOUNTER — Telehealth: Payer: Self-pay | Admitting: Emergency Medicine

## 2020-06-10 LAB — URINE CULTURE: Culture: <10000 — AB

## 2020-06-10 NOTE — Telephone Encounter (Signed)
Called pateint to inform of std result positive for gonorrhea.  He was not treated during ED visit.  Would like him to either go to [facility] or to PCP for needed treatment.  He did not answer and voicemail box is full.  Will try again later.

## 2020-06-11 ENCOUNTER — Ambulatory Visit: Payer: Self-pay

## 2020-06-11 NOTE — Telephone Encounter (Signed)
Called patient again to inform of std.  I told him gonorrhea was positive.  He says he does not have pcp.  I gave him number for [facility] std clinic where he can be treated.  Also advised to inform partner (s)

## 2020-07-14 ENCOUNTER — Encounter: Payer: Self-pay | Admitting: Internal Medicine

## 2020-07-14 ENCOUNTER — Other Ambulatory Visit: Payer: Self-pay

## 2020-07-14 ENCOUNTER — Ambulatory Visit (INDEPENDENT_AMBULATORY_CARE_PROVIDER_SITE_OTHER): Payer: Medicaid Other | Admitting: Internal Medicine

## 2020-07-14 VITALS — BP 130/90 | HR 58 | Ht 70.0 in | Wt 196.0 lb

## 2020-07-14 DIAGNOSIS — I10 Essential (primary) hypertension: Secondary | ICD-10-CM

## 2020-07-14 DIAGNOSIS — E1159 Type 2 diabetes mellitus with other circulatory complications: Secondary | ICD-10-CM

## 2020-07-14 DIAGNOSIS — I2510 Atherosclerotic heart disease of native coronary artery without angina pectoris: Secondary | ICD-10-CM

## 2020-07-14 DIAGNOSIS — E119 Type 2 diabetes mellitus without complications: Secondary | ICD-10-CM | POA: Insufficient documentation

## 2020-07-14 DIAGNOSIS — E785 Hyperlipidemia, unspecified: Secondary | ICD-10-CM

## 2020-07-14 MED ORDER — LISINOPRIL 20 MG PO TABS: 20.0000 mg | ORAL_TABLET | Freq: Every day | ORAL | 0 refills | Status: DC

## 2020-07-14 NOTE — Progress Notes (Signed)
Follow-up Outpatient Visit Date: 07/14/2020  Primary Care Provider: Patient, No Pcp Per No address on file  Chief Complaint: Follow-up coronary artery disease  HPI:  Todd Reynolds is a 64 y.o. male with history of coronary artery disease (NSTEMI in 08/2018 status post PCI to LCx), ischemic cardiomyopathy, and hyperlipidemia, who presents for follow-up of coronary artery disease. I last saw Todd Reynolds in April, at which time he was doing well.  He continues to feel well without chest pain, shortness of breath, palpitations, lightheadedness, or edema.  He is not having any medication side effects other than some intolerance to higher doses of Metformin that he was prescribed through the health department.  He does not have a PCP at this time.  He was recently diagnosed with gonorrhea and was treated with IM antibiotics.  --------------------------------------------------------------------------------------------------  Past Medical History:  Diagnosis Date  . CAD with hx of NSTEMI    a. 08/2018 NSTEMI/PCI: LM 20d, LAD 50p/d, D1 85ost, RI 90, LCX 17m/d - thrombotic (3.0x15 [place] DES), OM1 small/nl, OM2 mod/nl, OM3 large - fills via dLAD collats, RCA 40p/m, RPDA1 60, RPDA2 75, RPAV mod/nl. EF 45-50%.  . Hyperlipidemia LDL goal <70   . Ischemic cardiomyopathy    a. 08/2018 LV gram: EF 45-50%; b. 08/2018 Echo: EF 50-55%, mild inflat HK. Mild MR, mildly dil LA/RA.   Past Surgical History:  Procedure Laterality Date  . CORONARY STENT INTERVENTION N/A 08/28/2018   Procedure: CORONARY STENT INTERVENTION;  Surgeon: Yvonne Kendall, MD;  Location: [facility] INVASIVE CV LAB;  Service: Cardiovascular;  Laterality: N/A;  . CORONARY THROMBECTOMY N/A 08/28/2018   Procedure: Coronary Thrombectomy;  Surgeon: Yvonne Kendall, MD;  Location: [facility] INVASIVE CV LAB;  Service: Cardiovascular;  Laterality: N/A;  . LEFT HEART CATH AND CORONARY ANGIOGRAPHY N/A 08/28/2018   Procedure: LEFT HEART CATH AND CORONARY  ANGIOGRAPHY;  Surgeon: Yvonne Kendall, MD;  Location: [facility] INVASIVE CV LAB;  Service: Cardiovascular;  Laterality: N/A;    Current Meds  Medication Sig  . aspirin 81 MG EC tablet Take 1 tablet (81 mg total) by mouth daily.  Marland Kitchen atorvastatin (LIPITOR) 80 MG tablet Take 1 tablet (80 mg total) by mouth daily at 6 PM.  . clopidogrel (PLAVIX) 75 MG tablet Take 1 tablet by mouth once daily  . lisinopril (ZESTRIL) 10 MG tablet Take 1 tablet (10 mg total) by mouth daily.  . metFORMIN (GLUCOPHAGE) 1000 MG tablet Take 0.5 tablets (500 mg total) by mouth 2 (two) times daily with a meal.  . metoprolol tartrate (LOPRESSOR) 25 MG tablet Take 1/2 (one-half) tablet by mouth twice daily  . nitroGLYCERIN (NITROSTAT) 0.4 MG SL tablet Place 1 tablet (0.4 mg total) under the tongue every 5 (five) minutes as needed for chest pain.  Marland Kitchen triamcinolone ointment (KENALOG) 0.5 % Apply 1 application topically 2 (two) times daily.    Allergies: Patient has no known allergies.  Social History   Tobacco Use  . Smoking status: Never Smoker  . Smokeless tobacco: Never Used  Vaping Use  . Vaping Use: Never used  Substance Use Topics  . Alcohol use: Never  . Drug use: Never    Family History  Problem Relation Age of Onset  . Hypertension Sister   . Heart attack Sister   . Diabetes Sister   . Heart disease Brother     Review of Systems: A 12-system review of systems was performed and was negative except as noted in the HPI.  --------------------------------------------------------------------------------------------------  Physical Exam: BP 130/90 (  BP Location: Left Arm, Patient Position: Sitting, Cuff Size: Normal)   Pulse (!) 58   Ht 5\' 10"  (1.778 m)   Wt 196 lb (88.9 kg)   SpO2 98%   BMI 28.12 kg/m   General: NAD. Neck: No JVD or HJR. Lungs: Clear to auscultation bilateral without wheezes or crackles. Heart: Bradycardic but regular without murmurs, rubs, or gallops. Abdomen: Soft, nontender,  nondistended. Extremities: No lower extremity edema.  EKG: Sinus bradycardia.  Otherwise, no significant abnormality.  Heart rate has decreased since 01/07/2020.  Otherwise, there has been no significant interval change.  Lab Results  Component Value Date   WBC 5.8 03/27/2019   HGB 14.3 03/27/2019   HCT 43.1 03/27/2019   MCV 89 03/27/2019   PLT 214 03/27/2019    Lab Results  Component Value Date   NA 145 (H) 10/22/2018   K 4.8 10/22/2018   CL 104 10/22/2018   CO2 23 10/22/2018   BUN 12 10/22/2018   CREATININE 1.10 10/22/2018   GLUCOSE 100 (H) 10/22/2018   ALT 31 03/27/2019    Lab Results  Component Value Date   CHOL 115 03/27/2019   HDL 29 (L) 03/27/2019   LDLCALC 67 03/27/2019   TRIG 97 03/27/2019   CHOLHDL 4.0 03/27/2019    --------------------------------------------------------------------------------------------------  ASSESSMENT AND PLAN: Coronary artery disease: Todd Reynolds continues to do well without recurrent angina.  We will continue his current medications for secondary prevention.  Given the long-term DAPT, I will check a CBC today.  Hypertension: Blood pressure borderline elevated at 130/90.  We have agreed to increase lisinopril to 20 mg daily.  I will check a CMP today.  He will need a BMP in about 2 weeks as well to ensure stable renal function and electrolytes.  Hyperlipidemia: LDL at goal on last check.  We will repeat a fasting lipid panel today and also check a CMP.  We will plan to continue high intensity statin therapy with atorvastatin 80 mg daily.  Type 2 diabetes mellitus: Patient currently on Metformin prescribed to the health department.  He does not have a PCP to manage this.  I will check a hemoglobin A1c today to assess his glycemic status.  I will also refer him to a PCP for ongoing management of this.  Follow-up: Return to clinic in 3 months.  Morrie Sheldon, MD 07/14/2020 9:48 AM

## 2020-07-14 NOTE — Patient Instructions (Addendum)
  Referral to Primary Care.  Please call [facility] at [phone].   [facility] - call [phone]  * You will need to call these and see about getting an appointment.    Medication Instructions:  Your physician has recommended you make the following change in your medication:  1- INCREASE Lisinopril to 20 mg by mouth once a [place].  *If you need a refill on your cardiac medications before your next appointment, please call your pharmacy*  Lab Work: 1- Your physician recommends that you return for lab work in: TODAY - CBC, LIPID, CMP, A1c.  2- Your physician recommends that you return for lab work in: 2 weeks (around 07/28/20) for BMP. - Please go to the [facility]. You will check in at the front desk to the right as you walk into the atrium. Valet Parking is offered if needed. - No appointment needed. You may go any [place] between 7 am and 6 pm.  If you have labs (blood work) drawn today and your tests are completely normal, you will receive your results only by: Marland Kitchen MyChart Message (if you have MyChart) OR . A paper copy in the mail If you have any lab test that is abnormal or we need to change your treatment, we will call you to review the results.  Testing/Procedures: none  Follow-Up: At [facility], you and your health needs are our priority.  As part of our continuing mission to provide you with exceptional heart care, we have created designated Provider Care Teams.  These Care Teams include your primary Cardiologist (physician) and Advanced Practice Providers (APPs -  Physician Assistants and Nurse Practitioners) who all work together to provide you with the care you need, when you need it.  We recommend signing up for the patient portal called "MyChart".  Sign up information is provided on this After Visit Summary.  MyChart is used to connect with patients for Virtual Visits (Telemedicine).  Patients are able to view lab/test results, encounter  notes, upcoming appointments, etc.  Non-urgent messages can be sent to your provider as well.   To learn more about what you can do with MyChart, go to [email or web address].    Your next appointment:   3 month(s)  The format for your next appointment:   In Person  Provider:   You may see Yvonne Kendall, MD or one of the following Advanced Practice Providers on your designated Care Team:    Nicolasa Ducking, NP  Eula Listen, PA-C  Marisue Ivan, PA-C  Cadence [place], [place]

## 2020-07-15 LAB — COMPREHENSIVE METABOLIC PANEL
ALT: 23 IU/L (ref 0–44)
AST: 21 IU/L (ref 0–40)
Albumin/Globulin Ratio: 1.7 (ref 1.2–2.2)
Albumin: 4.3 g/dL (ref 3.8–4.8)
Alkaline Phosphatase: 95 IU/L (ref 44–121)
BUN/Creatinine Ratio: 9 — ABNORMAL LOW (ref 10–24)
BUN: 10 mg/dL (ref 8–27)
Bilirubin Total: 1.4 mg/dL — ABNORMAL HIGH (ref 0.0–1.2)
CO2: 23 mmol/L (ref 20–29)
Calcium: 9.5 mg/dL (ref 8.6–10.2)
Chloride: 107 mmol/L — ABNORMAL HIGH (ref 96–106)
Creatinine, Ser: 1.1 mg/dL (ref 0.76–1.27)
GFR calc Af Amer: 83 mL/min/{1.73_m2} (ref >59)
GFR calc non Af Amer: 72 mL/min/{1.73_m2} (ref >59)
Globulin, Total: 2.5 g/dL (ref 1.5–4.5)
Glucose: 126 mg/dL — ABNORMAL HIGH (ref 65–99)
Potassium: 4.1 mmol/L (ref 3.5–5.2)
Sodium: 141 mmol/L (ref 134–144)
Total Protein: 6.8 g/dL (ref 6.0–8.5)

## 2020-07-15 LAB — CBC WITH DIFFERENTIAL/PLATELET
Basophils Absolute: 0 10*3/uL (ref 0.0–0.2)
Basos: 0 %
EOS (ABSOLUTE): 0.2 10*3/uL (ref 0.0–0.4)
Eos: 4 %
Hematocrit: 44.1 % (ref 37.5–51.0)
Hemoglobin: 14.6 g/dL (ref 13.0–17.7)
Immature Grans (Abs): 0 10*3/uL (ref 0.0–0.1)
Immature Granulocytes: 0 %
Lymphocytes Absolute: 1 10*3/uL (ref 0.7–3.1)
Lymphs: 21 %
MCH: 28.9 pg (ref 26.6–33.0)
MCHC: 33.1 g/dL (ref 31.5–35.7)
MCV: 87 fL (ref 79–97)
Monocytes Absolute: 0.3 10*3/uL (ref 0.1–0.9)
Monocytes: 6 %
Neutrophils Absolute: 3.3 10*3/uL (ref 1.4–7.0)
Neutrophils: 69 %
Platelets: 219 10*3/uL (ref 150–450)
RBC: 5.05 x10E6/uL (ref 4.14–5.80)
RDW: 13.7 % (ref 11.6–15.4)
WBC: 4.8 10*3/uL (ref 3.4–10.8)

## 2020-07-15 LAB — LIPID PANEL
Chol/HDL Ratio: 3.5 ratio (ref 0.0–5.0)
Cholesterol, Total: 138 mg/dL (ref 100–199)
HDL: 39 mg/dL — ABNORMAL LOW (ref >39)
LDL Chol Calc (NIH): 87 mg/dL (ref 0–99)
Triglycerides: 57 mg/dL (ref 0–149)
VLDL Cholesterol Cal: 12 mg/dL (ref 5–40)

## 2020-07-15 LAB — HEMOGLOBIN A1C
Est. average glucose Bld gHb Est-mCnc: 137 mg/dL
Hgb A1c MFr Bld: 6.4 % — ABNORMAL HIGH (ref 4.8–5.6)

## 2020-09-10 ENCOUNTER — Other Ambulatory Visit: Payer: Self-pay | Admitting: Internal Medicine

## 2020-09-28 ENCOUNTER — Other Ambulatory Visit: Payer: Self-pay | Admitting: Internal Medicine

## 2020-11-17 ENCOUNTER — Encounter: Payer: Self-pay | Admitting: Internal Medicine

## 2020-11-17 ENCOUNTER — Ambulatory Visit (INDEPENDENT_AMBULATORY_CARE_PROVIDER_SITE_OTHER): Payer: Medicaid Other | Admitting: Internal Medicine

## 2020-11-17 ENCOUNTER — Other Ambulatory Visit: Payer: Self-pay

## 2020-11-17 ENCOUNTER — Other Ambulatory Visit
Admission: RE | Admit: 2020-11-17 | Discharge: 2020-11-17 | Disposition: A | Discharge status: Home / Self Care | Payer: Medicaid Other | Source: Ambulatory Visit | Attending: Internal Medicine | Admitting: Internal Medicine

## 2020-11-17 VITALS — BP 120/84 | HR 80 | Ht 70.0 in | Wt 198.4 lb

## 2020-11-17 DIAGNOSIS — I10 Essential (primary) hypertension: Secondary | ICD-10-CM | POA: Diagnosis present

## 2020-11-17 DIAGNOSIS — I255 Ischemic cardiomyopathy: Secondary | ICD-10-CM

## 2020-11-17 DIAGNOSIS — Z79899 Other long term (current) drug therapy: Secondary | ICD-10-CM

## 2020-11-17 DIAGNOSIS — E1169 Type 2 diabetes mellitus with other specified complication: Secondary | ICD-10-CM

## 2020-11-17 DIAGNOSIS — N529 Male erectile dysfunction, unspecified: Secondary | ICD-10-CM

## 2020-11-17 DIAGNOSIS — I2510 Atherosclerotic heart disease of native coronary artery without angina pectoris: Secondary | ICD-10-CM | POA: Diagnosis not present

## 2020-11-17 DIAGNOSIS — E785 Hyperlipidemia, unspecified: Secondary | ICD-10-CM

## 2020-11-17 LAB — BASIC METABOLIC PANEL
Anion gap: 8 (ref 5–15)
BUN: 13 mg/dL (ref 8–23)
CO2: 24 mmol/L (ref 22–32)
Calcium: 9.3 mg/dL (ref 8.9–10.3)
Chloride: 106 mmol/L (ref 98–111)
Creatinine, Ser: 1.14 mg/dL (ref 0.61–1.24)
GFR, Estimated: >60 mL/min (ref >60)
Glucose, Bld: 97 mg/dL (ref 70–99)
Potassium: 4 mmol/L (ref 3.5–5.1)
Sodium: 138 mmol/L (ref 135–145)

## 2020-11-17 MED ORDER — LISINOPRIL 20 MG PO TABS: 20.0000 mg | ORAL_TABLET | Freq: Every day | ORAL | 2 refills | Status: DC

## 2020-11-17 MED ORDER — SILDENAFIL CITRATE 20 MG PO TABS: 20.0000 mg | ORAL_TABLET | ORAL | 1 refills | Status: DC | PRN

## 2020-11-17 MED ORDER — LISINOPRIL 40 MG PO TABS: 40.0000 mg | ORAL_TABLET | Freq: Every day | ORAL | 2 refills | Status: DC

## 2020-11-17 NOTE — Patient Instructions (Addendum)
Medication Instructions:  Your physician has recommended you make the following change in your medication:  1- TAKE Lisinopril 20 mg by mouth once a [place].  *If you need a refill on your cardiac medications before your next appointment, please call your pharmacy*   Lab Work: Your physician recommends that you return for lab work in: TODAY - BMET. - Please go to the [facility]. You will check in at the front desk to the right as you walk into the atrium. Valet Parking is offered if needed. - No appointment needed. You may go any [place] between 7 am and 6 pm.  If you have labs (blood work) drawn today and your tests are completely normal, you will receive your results only by: Marland Kitchen MyChart Message (if you have MyChart) OR . A paper copy in the mail If you have any lab test that is abnormal or we need to change your treatment, we will call you to review the results.   Testing/Procedures: none  Follow-Up:  You have been referred to [facility]. Please call [phone] to schedule an appointment.   At [facility], you and your health needs are our priority.  As part of our continuing mission to provide you with exceptional heart care, we have created designated Provider Care Teams.  These Care Teams include your primary Cardiologist (physician) and Advanced Practice Providers (APPs -  Physician Assistants and Nurse Practitioners) who all work together to provide you with the care you need, when you need it.  We recommend signing up for the patient portal called "MyChart".  Sign up information is provided on this After Visit Summary.  MyChart is used to connect with patients for Virtual Visits (Telemedicine).  Patients are able to view lab/test results, encounter notes, upcoming appointments, etc.  Non-urgent messages can be sent to your provider as well.   To learn more about what you can do with MyChart, go to [email or web address].    Your next appointment:   6  month(s)  The format for your next appointment:   In Person  Provider:   You may see Yvonne Kendall, MD or one of the following Advanced Practice Providers on your designated Care Team:    Nicolasa Ducking, NP  Eula Listen, PA-C  Marisue Ivan, PA-C  Cadence [place], [place]  Gillian Shields, NP

## 2020-11-17 NOTE — Progress Notes (Signed)
Follow-up Outpatient Visit Date: 11/17/2020  Primary Care Provider: Patient, No Pcp Per No address on file  Chief Complaint: Follow-up CAD and cardiomyopathy  HPI:  Todd Reynolds is a 65 y.o. male with history of coronary artery disease (NSTEMI in 08/2018 status post PCI to LCx), ischemic cardiomyopathy, and hyperlipidemia, who presents for follow-up of coronary artery disease.  I last saw him in 06/2020, at which time Mr. Bertram was doing well.  Due to suboptimal blood pressure control, lisinopril was increased to 20 mg daily.  Today, Mr. Foskett reports that he has been feeling a lot better than at our prior visits.  He denies chest pain, shortness of breath, palpitations, lightheadedness, and edema.  He notes occasional leg cramps when he is very active.  On further questioning, it appears that he has been taking lisinopril 40 mg daily instead of 20 mg daily for the last week.  He has not been able to establish with a new PCP.  He continues to have erectile dysfunction and inquires about a refill for sildenafil.  --------------------------------------------------------------------------------------------------  Past Medical History:  Diagnosis Date  . CAD with hx of NSTEMI    a. 08/2018 NSTEMI/PCI: LM 20d, LAD 50p/d, D1 85ost, RI 90, LCX 11m/d - thrombotic (3.0x15 [place] DES), OM1 small/nl, OM2 mod/nl, OM3 large - fills via dLAD collats, RCA 40p/m, RPDA1 60, RPDA2 75, RPAV mod/nl. EF 45-50%.  . Hyperlipidemia LDL goal <70   . Ischemic cardiomyopathy    a. 08/2018 LV gram: EF 45-50%; b. 08/2018 Echo: EF 50-55%, mild inflat HK. Mild MR, mildly dil LA/RA.   Past Surgical History:  Procedure Laterality Date  . CORONARY STENT INTERVENTION N/A 08/28/2018   Procedure: CORONARY STENT INTERVENTION;  Surgeon: Yvonne Kendall, MD;  Location: [facility] INV[facility]IVE CV [facility];  Service: Cardiovascular;  Laterality: N/A;  . CORONARY THROMBECTOMY N/A 08/28/2018   Procedure: Coronary Thrombectomy;  Surgeon: Yvonne Kendall, MD;  Location: [facility];  Service: Cardiovascular;  Laterality: N/A;  . LEFT HEART CATH AND CORONARY ANGIOGRAPHY N/A 08/28/2018   Procedure: LEFT HEART CATH AND CORONARY ANGIOGRAPHY;  Surgeon: Yvonne Kendall, MD;  Location: [facility];  Service: Cardiovascular;  Laterality: N/A;    Current Meds  Medication Sig  . aspirin 81 MG EC tablet Take 1 tablet (81 mg total) by mouth daily.  Marland Kitchen atorvastatin (LIPITOR) 80 MG tablet Take 1 tablet (80 mg total) by mouth daily at 6 PM.  . clopidogrel (PLAVIX) 75 MG tablet Take 1 tablet (75 mg total) by mouth daily.  Marland Kitchen lisinopril (ZESTRIL) 20 MG tablet Take 1 tablet by mouth once daily (Patient taking differently: Take 40 mg by mouth daily.)  . metFORMIN (GLUCOPHAGE) 1000 MG tablet Take 0.5 tablets (500 mg total) by mouth 2 (two) times daily with a meal.  . metoprolol tartrate (LOPRESSOR) 25 MG tablet Take 1/2 (one-half) tablet by mouth twice daily  . nitroGLYCERIN (NITROSTAT) 0.4 MG SL tablet Place 1 tablet (0.4 mg total) under the tongue every 5 (five) minutes as needed for chest pain.  Marland Kitchen triamcinolone ointment (KENALOG) 0.5 % Apply 1 application topically 2 (two) times daily.  . [DISCONTINUED] lisinopril (ZESTRIL) 20 MG tablet Take 1 tablet (20 mg total) by mouth daily.    Allergies: Patient has no known allergies.  Social History   Tobacco Use  . Smoking status: Never Smoker  . Smokeless tobacco: Never Used  Vaping Use  . Vaping Use: Never used  Substance Use Topics  . Alcohol use: Never  .  Drug use: Never    Family History  Problem Relation Age of Onset  . Hypertension Sister   . Heart attack Sister   . Diabetes Sister   . Heart disease Brother     Review of Systems: A 12-system review of systems was performed and was negative except as noted in the HPI.  --------------------------------------------------------------------------------------------------  Physical Exam: BP 120/84 (BP Location: Left  Arm, Patient Position: Sitting, Cuff Size: Normal)   Pulse 80   Ht 5\' 10"  (1.778 m)   Wt 198 lb 6 oz (90 kg)   SpO2 98%   BMI 28.46 kg/m   General:  NAD. Neck: No JVD or HJR. Lungs: Clear to auscultation bilaterally without wheezes or crackles. Heart: Regular rate and rhythm without murmurs, rubs, or gallops. Abdomen: Soft, nontender, nondistended. Extremities: No lower extremity edema.  EKG: Normal sinus rhythm without abnormality.  Lab Results  Component Value Date   WBC 4.8 07/14/2020   HGB 14.6 07/14/2020   HCT 44.1 07/14/2020   MCV 87 07/14/2020   PLT 219 07/14/2020    Lab Results  Component Value Date   NA 141 07/14/2020   K 4.1 07/14/2020   CL 107 (H) 07/14/2020   CO2 23 07/14/2020   BUN 10 07/14/2020   CREATININE 1.10 07/14/2020   GLUCOSE 126 (H) 07/14/2020   ALT 23 07/14/2020    Lab Results  Component Value Date   CHOL 138 07/14/2020   HDL 39 (L) 07/14/2020   LDLCALC 87 07/14/2020   TRIG 57 07/14/2020   CHOLHDL 3.5 07/14/2020    --------------------------------------------------------------------------------------------------  ASSESSMENT AND PLAN: Coronary artery disease without angina: No symptoms reported.  Continue current medications for secondary prevention.  Ischemic cardiomyopathy: Mr. Mich appears euvolemic with NYHA class I heart failure symptoms.  We had previously increased lisinopril to 20 mg daily though he is actually been taking 40 mg daily over the last week.  I recommended continuation of lisinopril 40 mg daily, though Mr. Lineman wishes to go back to 20 mg daily, as he feels like he has been a little bit more fatigued the last week.  Hypertension: Diastolic blood pressure little high today.  As above, we will decrease lisinopril Bracht to 20 mg daily.  Continue current dose of metoprolol.  We will need to readdress escalation of lisinopril versus addition of another agent at follow-up if blood pressure remains above goal (less than  130/80).  I will check a BMP today to ensure stable renal function and potassium.  Hyperlipidemia associated with type 2 diabetes mellitus: LDL just above goal on last check in 06/2020.  Continue atorvastatin 80 mg daily and work on lifestyle modifications.  We will refer Mr. Eagles to the [facility] to establish with a PCP for ongoing management of his type 2 diabetes mellitus.  For now, we will continue Metformin.  Erectile dysfunction: Refill for sildenafil 20 mg daily as needed for erectile dysfunction was provided.  Further refills to come from PCP.  Importance of avoiding nitrates within 24 hours of sildenafil use was reinforced.  Follow-up: Return to clinic in 6 months.  [facility], MD 11/17/2020 3:48 PM

## 2020-12-07 ENCOUNTER — Telehealth: Payer: Self-pay | Admitting: *Deleted

## 2020-12-07 NOTE — Telephone Encounter (Signed)
Patient needs to establish with primary care provider. Phone number for [facility] was given to patient at last office visit on 11/17/20 to call and schedule.  I called [facility] to confirm patient has scheduled a appointment. He is not in their system. They do not have any new patient appointments until May and that schedule does not come out until the first week of April. Scheduler advised patient call at that time for appointment.  Attempted to call patient to let him know this information. No answer. Left message to call back.

## 2020-12-08 NOTE — Telephone Encounter (Signed)
Attempted to reach patient. No answer and mailbox is full. Will try again to reach patient to encourage him to call [facility] in April for new patient appointment.  Patient was aware to call after appointment on 11/17/20 as it was written on his AVS.

## 2020-12-09 ENCOUNTER — Encounter: Payer: Self-pay | Admitting: *Deleted

## 2020-12-09 NOTE — Telephone Encounter (Signed)
Unable to reach patient. Mailbox is full. Letter sent to patient to call [facility] on or after April first for new patient appointment in May.

## 2020-12-21 ENCOUNTER — Other Ambulatory Visit: Payer: Self-pay | Admitting: Internal Medicine

## 2021-02-24 ENCOUNTER — Other Ambulatory Visit: Payer: Self-pay | Admitting: Internal Medicine

## 2021-05-20 ENCOUNTER — Other Ambulatory Visit: Payer: Self-pay | Admitting: Internal Medicine

## 2021-05-25 ENCOUNTER — Ambulatory Visit: Payer: Medicaid Other | Admitting: Internal Medicine

## 2021-05-25 NOTE — Progress Notes (Deleted)
Follow-up Outpatient Visit Date: 05/25/2021  Primary Care Provider: Patient, No Pcp Per (Inactive) No address on file  Chief Complaint: ***  HPI:  Todd Reynolds is a 65 y.o. male with history of coronary artery disease (NSTEMI in 08/2018 status post PCI to LCx), ischemic cardiomyopathy, and hyperlipidemia, who presents for follow-up of coronary artery disease.  I last saw him in March, at which time he was feeling well other than occasional leg cramps.  He was accidentally taking lisinopril 40 mg daily rather than 20 mg daily as prescribed, and was tolerating this well.  However, he wished to decrease it back to 20 mg daily because he thought that the higher dose might be making him a bit fatigued.  --------------------------------------------------------------------------------------------------  Past Medical History:  Diagnosis Date   CAD with hx of NSTEMI    a. 08/2018 NSTEMI/PCI: LM 20d, LAD 50p/d, D1 85ost, RI 90, LCX 158m/d - thrombotic (3.0x15 [place] DES), OM1 small/nl, OM2 mod/nl, OM3 large - fills via dLAD collats, RCA 40p/m, RPDA1 60, RPDA2 75, RPAV mod/nl. EF 45-50%.   Hyperlipidemia LDL goal <70    Ischemic cardiomyopathy    a. 08/2018 LV gram: EF 45-50%; b. 08/2018 Echo: EF 50-55%, mild inflat HK. Mild MR, mildly dil LA/RA.   Past Surgical History:  Procedure Laterality Date   CORONARY STENT INTERVENTION N/A 08/28/2018   Procedure: CORONARY STENT INTERVENTION;  Surgeon: Yvonne Kendall, MD;  Location: [facility];  Service: Cardiovascular;  Laterality: N/A;   CORONARY THROMBECTOMY N/A 08/28/2018   Procedure: Coronary Thrombectomy;  Surgeon: Yvonne Kendall, MD;  Location: [facility];  Service: Cardiovascular;  Laterality: N/A;   LEFT HEART CATH AND CORONARY ANGIOGRAPHY N/A 08/28/2018   Procedure: LEFT HEART CATH AND CORONARY ANGIOGRAPHY;  Surgeon: Yvonne Kendall, MD;  Location: [facility];  Service: Cardiovascular;  Laterality: N/A;     No  outpatient medications have been marked as taking for the 05/25/21 encounter (Appointment) with Todd Reynolds, Todd Deer, MD.    Allergies: Patient has no known allergies.  Social History   Tobacco Use   Smoking status: Never   Smokeless tobacco: Never  Vaping Use   Vaping Use: Never used  Substance Use Topics   Alcohol use: Never   Drug use: Never    Family History  Problem Relation Age of Onset   Hypertension Sister    Heart attack Sister    Diabetes Sister    Heart disease Brother     Review of Systems: A 12-system review of systems was performed and was negative except as noted in the HPI.  --------------------------------------------------------------------------------------------------  Physical Exam: There were no vitals taken for this visit.  General:  NAD. Neck: No JVD or HJR. Lungs: Clear to auscultation bilaterally without wheezes or crackles. Heart: Regular rate and rhythm without murmurs, rubs, or gallops. Abdomen: Soft, nontender, nondistended. Extremities: No lower extremity edema.  EKG:  ***  Lab Results  Component Value Date   WBC 4.8 07/14/2020   HGB 14.6 07/14/2020   HCT 44.1 07/14/2020   MCV 87 07/14/2020   PLT 219 07/14/2020    Lab Results  Component Value Date   NA 138 11/17/2020   K 4.0 11/17/2020   CL 106 11/17/2020   CO2 24 11/17/2020   BUN 13 11/17/2020   CREATININE 1.14 11/17/2020   GLUCOSE 97 11/17/2020   ALT 23 07/14/2020    Lab Results  Component Value Date   CHOL 138 07/14/2020   HDL 39 (L) 07/14/2020  LDLCALC 87 07/14/2020   TRIG 57 07/14/2020   CHOLHDL 3.5 07/14/2020    --------------------------------------------------------------------------------------------------  ASSESSMENT AND PLAN: Todd Reynolds Kahlen Morais, MD 05/25/2021 6:20 AM

## 2022-06-23 ENCOUNTER — Ambulatory Visit: Payer: Medicaid Other | Admitting: Nurse Practitioner

## 2022-06-27 ENCOUNTER — Encounter: Payer: Self-pay | Admitting: Nurse Practitioner

## 2022-06-27 ENCOUNTER — Ambulatory Visit: Payer: Medicaid Other | Attending: Nurse Practitioner | Admitting: Nurse Practitioner

## 2022-06-27 VITALS — BP 114/82 | HR 68 | Ht 70.0 in | Wt 196.2 lb

## 2022-06-27 DIAGNOSIS — I2510 Atherosclerotic heart disease of native coronary artery without angina pectoris: Secondary | ICD-10-CM | POA: Diagnosis not present

## 2022-06-27 DIAGNOSIS — E785 Hyperlipidemia, unspecified: Secondary | ICD-10-CM

## 2022-06-27 DIAGNOSIS — I10 Essential (primary) hypertension: Secondary | ICD-10-CM | POA: Diagnosis not present

## 2022-06-27 DIAGNOSIS — I255 Ischemic cardiomyopathy: Secondary | ICD-10-CM | POA: Diagnosis not present

## 2022-06-27 DIAGNOSIS — N529 Male erectile dysfunction, unspecified: Secondary | ICD-10-CM

## 2022-06-27 MED ORDER — NITROGLYCERIN 0.4 MG SL SUBL: 0.4000 mg | SUBLINGUAL_TABLET | SUBLINGUAL | 3 refills | Status: DC | PRN

## 2022-06-27 NOTE — Progress Notes (Signed)
Office Visit    Patient Name: Todd Reynolds Date of Encounter: 06/27/2022  Primary Care Provider:  Patient, No Pcp Per Primary Cardiologist:  Todd Kendall, MD  Chief Complaint    66 y/o ? w/ a h/o CAD and NSTEMI s/p DES  LCX, ischemic cardiomyopathy, and HL, who presents for follow-up related to CAD and heart failure.  Past Medical History    Past Medical History:  Diagnosis Date   CAD with hx of NSTEMI    a. 08/2018 NSTEMI/PCI: LM 20d, LAD 50p/d, D1 85ost, RI 90, LCX 122m/d - thrombotic (3.0x15 [place] DES), OM1 small/nl, OM2 mod/nl, OM3 large - fills via dLAD collats, RCA 40p/m, RPDA1 60, RPDA2 75, RPAV mod/nl. EF 45-50%.   Essential hypertension    Hyperlipidemia LDL goal <70    Ischemic cardiomyopathy    a. 08/2018 LV gram: EF 45-50%; b. 08/2018 Echo: EF 50-55%, mild inflat HK. Mild MR, mildly dil LA/RA.   Past Surgical History:  Procedure Laterality Date   CORONARY STENT INTERVENTION N/A 08/28/2018   Procedure: CORONARY STENT INTERVENTION;  Surgeon: Todd Kendall, MD;  Location: [facility];  Service: Cardiovascular;  Laterality: N/A;   CORONARY THROMBECTOMY N/A 08/28/2018   Procedure: Coronary Thrombectomy;  Surgeon: Todd Kendall, MD;  Location: [facility];  Service: Cardiovascular;  Laterality: N/A;   LEFT HEART CATH AND CORONARY ANGIOGRAPHY N/A 08/28/2018   Procedure: LEFT HEART CATH AND CORONARY ANGIOGRAPHY;  Surgeon: Todd Kendall, MD;  Location: [facility];  Service: Cardiovascular;  Laterality: N/A;    Allergies  No Known Allergies  History of Present Illness    66 year old male with above past medical history including CAD, hyperlipidemia, and ischemic cardiomyopathy.  In December 2019, he was admitted to [facility] with chest pain and non-STEMI.  Catheterization showed moderate to severe multivessel disease with 100% thrombotic occlusion in the mid to distal left circumflex.  The circumflex was successfully  intervened upon with placement of a drug-eluting stent.  EF was 45 to 50% by left ventriculography but 50 to 55% by echo.  He was noted to have residual disease involving the diagonal, ramus, and distal RCA though these areas were small (less than 2 mm), and medical therapy was recommended.  Mr. Todd Reynolds has been medically managed over the years without requirement for additional ischemic testing.  He was last seen in cardiology clinic in March 2022, at which time he was feeling well.  Since his last visit, he has continued to feel well.  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  He denies chest pain, palpitations, dyspnea, pnd, orthopnea, n, v, dizziness, syncope, edema, weight gain, or early satiety.   Home Medications    Current Outpatient Medications  Medication Sig Dispense Refill   aspirin 81 MG EC tablet Take 1 tablet (81 mg total) by mouth daily. 30 tablet 2   atorvastatin (LIPITOR) 80 MG tablet Take 1 tablet (80 mg total) by mouth daily. 90 tablet 1   clopidogrel (PLAVIX) 75 MG tablet Take 1 tablet by mouth once daily 90 tablet 0   lisinopril (ZESTRIL) 20 MG tablet Take 1 tablet by mouth once daily 90 tablet 0   metFORMIN (GLUCOPHAGE) 1000 MG tablet Take 0.5 tablets (500 mg total) by mouth 2 (two) times daily with a meal. 60 tablet 0   metoprolol tartrate (LOPRESSOR) 25 MG tablet Take 1/2 (one-half) tablet by mouth twice daily 90 tablet 0   sildenafil (REVATIO) 20 MG tablet Take 1 tablet (20 mg total)  by mouth as needed. 5 tablet 1   triamcinolone ointment (KENALOG) 0.5 % Apply 1 application topically 2 (two) times daily. 30 g 0   nitroGLYCERIN (NITROSTAT) 0.4 MG SL tablet Place 1 tablet (0.4 mg total) under the tongue every 5 (five) minutes as needed for chest pain. 30 tablet 3   No current facility-administered medications for this visit.     Review of Systems    He denies chest pain, palpitations, dyspnea, pnd, orthopnea, n, v, dizziness, syncope, edema, weight gain, or early satiety.  All other  systems reviewed and are otherwise negative except as noted above.    Physical Exam    VS:  BP 114/82 (BP Location: Left Arm, Patient Position: Sitting, Cuff Size: Normal)   Pulse 68   Ht 5\' 10"  (1.778 m)   Wt 196 lb 3.2 oz (89 kg)   SpO2 98%   BMI 28.15 kg/m  , BMI Body mass index is 28.15 kg/m.     GEN: Well nourished, well developed, in no acute distress. HEENT: normal. Neck: Supple, no JVD, carotid bruits, or masses. Cardiac: RRR, no murmurs, rubs, or gallops. No clubbing, cyanosis, edema.  Radials/PT 2+ and equal bilaterally.  Respiratory:  Respirations regular and unlabored, clear to auscultation bilaterally. GI: Soft, nontender, nondistended, BS + x 4. MS: no deformity or atrophy. Skin: warm and dry, no rash. Neuro:  Strength and sensation are intact. Psych: Normal affect.  Accessory Clinical Findings    ECG personally reviewed by me today -regular sinus rhythm- no acute changes.  Lab Results  Component Value Date   WBC 4.8 07/14/2020   HGB 14.6 07/14/2020   HCT 44.1 07/14/2020   MCV 87 07/14/2020   PLT 219 07/14/2020   Lab Results  Component Value Date   CREATININE 1.14 11/17/2020   BUN 13 11/17/2020   NA 138 11/17/2020   K 4.0 11/17/2020   CL 106 11/17/2020   CO2 24 11/17/2020   Lab Results  Component Value Date   ALT 23 07/14/2020   AST 21 07/14/2020   ALKPHOS 95 07/14/2020   BILITOT 1.4 (H) 07/14/2020   Lab Results  Component Value Date   CHOL 138 07/14/2020   HDL 39 (L) 07/14/2020   LDLCALC 87 07/14/2020   TRIG 57 07/14/2020   CHOLHDL 3.5 07/14/2020    Lab Results  Component Value Date   HGBA1C 6.4 (H) 07/14/2020    Assessment & Plan    1.  Coronary artery disease: Status post non-STEMI and PCI of the left circumflex and December 2019.  Known moderate residual disease without any recurrent symptoms.  He remains relatively active without symptoms or limitations and denies chest pain or dyspnea today.  ECG unremarkable.  He remains on  aspirin, statin, Plavix, beta-blocker, and ACE inhibitor therapy.  2.  Ischemic cardiomyopathy: EF 45 to 50% at the time of his non-STEMI in December 2019 with echo at that time showing an EF of 50 to 55% with mild inferolateral hypokinesis.  He is euvolemic on examination with stable heart rates and blood pressures.  Continue beta-blocker and ACE inhibitor therapy.  3.  Hyperlipidemia: LDL of 87 in October 2021.  He believes his primary care provider has been checking lipids on him.  We will request these records.  Goal LDL less than 70.  He remains on high potency statin therapy.  4.  Essential hypertension: Stable on beta-blocker and ACE inhibitor therapy.  5.  Erectile dysfunction: He has not required any nitrates and  continues to use sildenafil as needed.  6.  Disposition: Follow-up in 6 months or sooner if necessary.   Murray Hodgkins, NP 06/27/2022, 1:00 PM

## 2022-06-27 NOTE — Patient Instructions (Addendum)
Medication Instructions:  Your physician recommends that you continue on your current medications as directed. Please refer to the Current Medication list given to you today.  Call office with the name of your medicines   *If you need a refill on your cardiac medications before your next appointment, please call your pharmacy*   Lab Work: NONE   If you have labs (blood work) drawn today and your tests are completely normal, you will receive your results only by: [place] (if you have MyChart) OR A paper copy in the mail If you have any lab test that is abnormal or we need to change your treatment, we will call you to review the results.   Testing/Procedures: NONE    Follow-Up: At [facility], you and your health needs are our priority.  As part of our continuing mission to provide you with exceptional heart care, we have created designated Provider Care Teams.  These Care Teams include your primary Cardiologist (physician) and Advanced Practice Providers (APPs -  Physician Assistants and Nurse Practitioners) who all work together to provide you with the care you need, when you need it.  We recommend signing up for the patient portal called "MyChart".  Sign up information is provided on this After Visit Summary.  MyChart is used to connect with patients for Virtual Visits (Telemedicine).  Patients are able to view lab/test results, encounter notes, upcoming appointments, etc.  Non-urgent messages can be sent to your provider as well.   To learn more about what you can do with MyChart, go to [email or web address].    Your next appointment:   6 month(s)  The format for your next appointment:   In Person  Provider:   Nelva Bush, MD    Other Instructions Thank you for choosing [facility]!    Important Information About Sugar

## 2023-10-05 ENCOUNTER — Other Ambulatory Visit: Payer: Self-pay | Admitting: *Deleted

## 2023-10-05 ENCOUNTER — Ambulatory Visit: Payer: 59 | Attending: Nurse Practitioner | Admitting: Nurse Practitioner

## 2023-10-05 ENCOUNTER — Encounter: Payer: Self-pay | Admitting: Nurse Practitioner

## 2023-10-05 VITALS — BP 134/84 | HR 70 | Ht 70.0 in | Wt 203.6 lb

## 2023-10-05 DIAGNOSIS — Z79899 Other long term (current) drug therapy: Secondary | ICD-10-CM | POA: Diagnosis not present

## 2023-10-05 DIAGNOSIS — I2510 Atherosclerotic heart disease of native coronary artery without angina pectoris: Secondary | ICD-10-CM

## 2023-10-05 DIAGNOSIS — I255 Ischemic cardiomyopathy: Secondary | ICD-10-CM

## 2023-10-05 DIAGNOSIS — N529 Male erectile dysfunction, unspecified: Secondary | ICD-10-CM

## 2023-10-05 DIAGNOSIS — I10 Essential (primary) hypertension: Secondary | ICD-10-CM | POA: Diagnosis not present

## 2023-10-05 DIAGNOSIS — E785 Hyperlipidemia, unspecified: Secondary | ICD-10-CM

## 2023-10-05 MED ORDER — EZETIMIBE 10 MG PO TABS: 10.0000 mg | ORAL_TABLET | Freq: Every day | ORAL | 1 refills | Status: AC

## 2023-10-05 NOTE — Patient Instructions (Addendum)
Medication Instructions:  - Your physician has recommended you make the following change in your medication:   1) START Zetia 10 mg: - take 1 tablet by mouth once daily   *If you need a refill on your cardiac medications before your next appointment, please call your pharmacy*   Lab Work:  - Your provider recommends that you return for lab work in 6 weeks  Labs ordered: Lipid/ Liver panel You DO need to be fasting  Location: [facility] ([facility]) [address] ([address]) [address], [place] [address]  Lab Hours: Monday-Friday 8 am-4:30 pm  Lunch 1 pm- 2 pm    If you have labs (blood work) drawn today and your tests are completely normal, you will receive your results only by: [employer] (if you have MyChart) OR A paper copy in the mail If you have any lab test that is abnormal or we need to change your treatment, we will call you to review the results.   Testing/Procedures: - none ordered   Follow-Up: At [facility], you and your health needs are our priority.  As part of our continuing mission to provide you with exceptional heart care, we have created designated Provider Care Teams.  These Care Teams include your primary Cardiologist (physician) and Advanced Practice Providers (APPs -  Physician Assistants and Nurse Practitioners) who all work together to provide you with the care you need, when you need it.  We recommend signing up for the patient portal called "MyChart".  Sign up information is provided on this After Visit Summary.  MyChart is used to connect with patients for Virtual Visits (Telemedicine).  Patients are able to view lab/test results, encounter notes, upcoming appointments, etc.  Non-urgent messages can be sent to your provider as well.   To learn more about what you can do with MyChart, go to [email or web address].    Your next appointment:   1 year(s)  Provider:   You may see Yvonne Kendall, MD or one  of the following Advanced Practice Providers on your designated Care Team:   Nicolasa Ducking, NP   Other Instructions  Ezetimibe Tablets What is this medication? EZETIMIBE (ez ET i mibe) treats high cholesterol. It works by reducing the amount of cholesterol absorbed from the food you eat. This decreases the amount of bad cholesterol (such as LDL) in your blood. Changes to diet and exercise are often combined with this medication. This medicine may be used for other purposes; ask your health care provider or pharmacist if you have questions. COMMON BRAND NAME(S): Zetia What should I tell my care team before I take this medication? They need to know if you have any of these conditions: Kidney disease Liver disease Muscle cramps, pain Muscle injury Thyroid disease An unusual or allergic reaction to ezetimibe, other medications, foods, dyes, or preservatives Pregnant or trying to get pregnant Breast-feeding How should I use this medication? Take this medication by mouth. Take it as directed on the prescription label at the same time every [place]. You can take it with or without food. If it upsets your stomach, take it with food. Keep taking it unless your care team tells you to stop. Take bile acid sequestrants at a different time of [place] than this medication. Take this medication 2 hours BEFORE or 4 hours AFTER bile acid sequestrants. Talk to your care team about the use of this medication in children. While it may be prescribed for children as young as 10 for  selected conditions, precautions do apply. Overdosage: If you think you have taken too much of this medicine contact a poison control center or emergency room at once. NOTE: This medicine is only for you. Do not share this medicine with others. What if I miss a dose? If you miss a dose, take it as soon as you can. If it is almost time for your next dose, take only that dose. Do not take double or extra doses. What may interact with  this medication? Do not take this medication with any of the following: Fenofibrate Gemfibrozil This medication may also interact with the following: Antacids Cyclosporine Herbal medications like red yeast rice Other medications to lower cholesterol or triglycerides This list may not describe all possible interactions. Give your health care provider a list of all the medicines, herbs, non-prescription drugs, or dietary supplements you use. Also tell them if you smoke, drink alcohol, or use illegal drugs. Some items may interact with your medicine. What should I watch for while using this medication? Visit your care team for regular checks on your progress. Tell your care team if your symptoms do not start to get better or if they get worse. Your care team may tell you to stop taking this medication if you develop muscle problems. If your muscle problems do not go away after stopping this medication, contact your care team. Talk to your care team if you wish to become pregnant or think you might be pregnant. This medication can cause serious birth defects if taken during pregnancy. Talk to your care team before breastfeeding. Changes to your treatment plan may be needed. Taking this medication is only part of a total heart healthy program. Ask your care team if there are other changes you can make to improve your overall health. What side effects may I notice from receiving this medication? Side effects that you should report to your doctor or health care provider as soon as possible: Allergic reactions--skin rash, itching or hives, swelling of the face, lips, tongue, or throat Side effects that usually do not require medical attention (report to your doctor or health care provider if they continue or are bothersome): Diarrhea Joint pain This list may not describe all possible side effects. Call your doctor for medical advice about side effects. You may report side effects to FDA at  1-800-FDA-1088. Where should I keep my medication? Keep out of the reach of children and pets. Store at room temperature between 15 and 30 degrees C (59 and 86 degrees F). Protect from moisture. Get rid of any unused medication after the expiration date. NOTE: This sheet is a summary. It may not cover all possible information. If you have questions about this medicine, talk to your doctor, pharmacist, or health care provider.  2024 Elsevier/Gold Standard (2022-06-13 00:00:00)

## 2023-10-05 NOTE — Progress Notes (Signed)
Office Visit    Patient Name: Todd Reynolds Date of Encounter: 10/05/2023  Primary Care Provider:  Patient, No Pcp Per Primary Cardiologist:  Yvonne Kendall, MD  Chief Complaint    68 y.o. male w/ a h/o CAD and NSTEMI s/p DES  LCX, ischemic cardiomyopathy, and HL, who presents for follow-up related to CAD.  Past Medical History  Subjective   Past Medical History:  Diagnosis Date   CAD with hx of NSTEMI    a. 08/2018 NSTEMI/PCI: LM 20d, LAD 50p/d, D1 85ost, RI 90, LCX 130m/d - thrombotic (3.0x15 [place] DES), OM1 small/nl, OM2 mod/nl, OM3 large - fills via dLAD collats, RCA 40p/m, RPDA1 60, RPDA2 75, RPAV mod/nl. EF 45-50%.   Essential hypertension    Hyperlipidemia LDL goal <70    Ischemic cardiomyopathy    a. 08/2018 LV gram: EF 45-50%; b. 08/2018 Echo: EF 50-55%, mild inflat HK. Mild MR, mildly dil LA/RA.   Past Surgical History:  Procedure Laterality Date   CORONARY STENT INTERVENTION N/A 08/28/2018   Procedure: CORONARY STENT INTERVENTION;  Surgeon: Yvonne Kendall, MD;  Location: [facility] INVASIVE CV LAB;  Service: Cardiovascular;  Laterality: N/A;   CORONARY THROMBECTOMY N/A 08/28/2018   Procedure: Coronary Thrombectomy;  Surgeon: Yvonne Kendall, MD;  Location: [facility] INVASIVE CV LAB;  Service: Cardiovascular;  Laterality: N/A;   LEFT HEART CATH AND CORONARY ANGIOGRAPHY N/A 08/28/2018   Procedure: LEFT HEART CATH AND CORONARY ANGIOGRAPHY;  Surgeon: Yvonne Kendall, MD;  Location: [facility] INVASIVE CV LAB;  Service: Cardiovascular;  Laterality: N/A;    Allergies  No Known Allergies    History of Present Illness      68 y.o. y/o male with the above past medical history including CAD, hyperlipidemia, and ischemic cardiomyopathy.  In December 2019, he was admitted to [facility] with chest pain and non-STEMI.  Catheterization showed moderate to severe multivessel disease 100% thrombotic occlusion in the mid to distal left circumflex.  This was successfully intervened upon  with placement of a drug-eluting stent.  EF was 45 to 50% by ventriculography and subsequently 50 to 55% by echo.  He was noted to have residual disease involving the diagonal, ramus, and distal RCA, though these areas were small (less than 2 mm), and medical therapy was recommended.    [name] was last seen in cardiology clinic in October 2023, at which time he was doing well.  Over the past year plus, he has continued to do well.  He works in the [employer], and notes that his job requires quite a bit of exertion.  This is a slower time of year but he continues to note good activity tolerance.  He is not routinely exercising.  He denies chest pain, dyspnea, palpitations, dizziness, syncope, edema, or early satiety. Objective  Home Medications    Current Outpatient Medications  Medication Sig Dispense Refill   aspirin 81 MG EC tablet Take 1 tablet (81 mg total) by mouth daily. 30 tablet 2   atorvastatin (LIPITOR) 80 MG tablet Take 1 tablet (80 mg total) by mouth daily. 90 tablet 1   clopidogrel (PLAVIX) 75 MG tablet Take 1 tablet by mouth once daily 90 tablet 0   ezetimibe (ZETIA) 10 MG tablet Take 1 tablet (10 mg total) by mouth daily. 90 tablet 1   lisinopril (ZESTRIL) 20 MG tablet Take 1 tablet by mouth once daily 90 tablet 0   metFORMIN (GLUCOPHAGE) 1000 MG tablet Take 0.5 tablets (500 mg total) by mouth 2 (two) times  daily with a meal. 60 tablet 0   metoprolol tartrate (LOPRESSOR) 25 MG tablet Take 1/2 (one-half) tablet by mouth twice daily 90 tablet 0   nitroGLYCERIN (NITROSTAT) 0.4 MG SL tablet Place 1 tablet (0.4 mg total) under the tongue every 5 (five) minutes as needed for chest pain. 30 tablet 3   sildenafil (REVATIO) 20 MG tablet Take 1 tablet (20 mg total) by mouth as needed. 5 tablet 1   triamcinolone ointment (KENALOG) 0.5 % Apply 1 application topically 2 (two) times daily. 30 g 0   No current facility-administered medications for this visit.     Physical Exam    VS:   BP 134/84   Pulse 70   Ht 5\' 10"  (1.778 m)   Wt 203 lb 9.6 oz (92.4 kg)   SpO2 98%   BMI 29.21 kg/m  , BMI Body mass index is 29.21 kg/m.     Vitals:   10/05/23 0912 10/05/23 1313  BP: (!) 138/90 134/84  Pulse: 70   SpO2: 98%       GEN: Well nourished, well developed, in no acute distress. HEENT: normal. Neck: Supple, no JVD, carotid bruits, or masses. Cardiac: RRR, no murmurs, rubs, or gallops. No clubbing, cyanosis, edema.  Radials 2+/PT 2+ and equal bilaterally.  Respiratory:  Respirations regular and unlabored, clear to auscultation bilaterally. GI: Soft, nontender, nondistended, BS + x 4. MS: no deformity or atrophy. Skin: warm and dry, no rash. Neuro:  Strength and sensation are intact. Psych: Normal affect.  Accessory Clinical Findings    ECG personally reviewed by me today - EKG Interpretation Date/Time:  Friday October 05 2023 09:17:38 EST Ventricular Rate:  70 PR Interval:  154 QRS Duration:  74 QT Interval:  390 QTC Calculation: 421 R Axis:   -3  Text Interpretation: Normal sinus rhythm Normal ECG Confirmed by Nicolasa Ducking [phone]) on 10/05/2023 9:25:04 AM  - no acute changes.  Labs from [facility] dated July 2024: Total cholesterol 163, triglycerides 109, HDL 37, LDL 106 Total bilirubin 0.9, alkaline phosphatase 95, AST 20, ALT 23 Hemoglobin A1c 6.3 Sodium 142, potassium 4.4, chloride 106, CO2 22, BUN 9, creatinine 1.08, glucose 101     Assessment & Plan    1.  Coronary artery disease: Status post non-STEMI and PCI of the left circumflex in December 2019.  Known residual disease without any recurrent symptoms.  Remains relatively active without symptoms or limitations and denies chest pain or dyspnea today.  ECG without acute ST or T changes.  He remains on aspirin, statin, Plavix, beta-blocker, and ACE inhibitor.  Adding Zetia 10 mg daily in setting of an LDL of 106 in July.  2.  Ischemic cardiomyopathy: EF 45 to 50% at the time of his non-STEMI  December 2019 with echo at that time showing an EF of 50 to 55% with mild inferolateral hypokinesis.  He does well without symptoms or limitations and is euvolemic on examination today.  Heart rate and blood pressure stable.  Continue beta-blocker and ACE inhibitor therapy.  Labs in July notable for normal electrolytes and renal function.  3.  Hyperlipidemia: LDL was 106 in July.  He reports compliance with his atorvastatin.  I am adding Zetia 10 mg daily.  Discussed the importance of regular exercise, caloric striction, and weight loss.  Follow-up lipids and LFTs in 6 weeks.  4.  Primary hypertension: Stable on beta-blocker and ACE inhibitor.  5.  Rectal dysfunction: He has not required any nitrates and continues to use  sildenafil as needed.  He is aware that if he has chest pain following sildenafil use, he may not use nitrates and will need to call 911.  6.  Disposition: Follow-up lipids and LFTs in 6 weeks.    Nicolasa Ducking, NP 10/05/2023, 1:15 PM

## 2023-11-02 LAB — LIPID PANEL
Chol/HDL Ratio: 3.2 {ratio} (ref 0.0–5.0)
Cholesterol, Total: 120 mg/dL (ref 100–199)
HDL: 37 mg/dL — ABNORMAL LOW (ref >39)
LDL Chol Calc (NIH): 70 mg/dL (ref 0–99)
Triglycerides: 58 mg/dL (ref 0–149)
VLDL Cholesterol Cal: 13 mg/dL (ref 5–40)

## 2023-11-02 LAB — HEPATIC FUNCTION PANEL
ALT: 60 [IU]/L — ABNORMAL HIGH (ref 0–44)
AST: 41 [IU]/L — ABNORMAL HIGH (ref 0–40)
Albumin: 4.3 g/dL (ref 3.9–4.9)
Alkaline Phosphatase: 86 [IU]/L (ref 44–121)
Bilirubin Total: 1.2 mg/dL (ref 0.0–1.2)
Bilirubin, Direct: 0.27 mg/dL (ref 0.00–0.40)
Total Protein: 6.8 g/dL (ref 6.0–8.5)

## 2023-11-09 ENCOUNTER — Other Ambulatory Visit: Payer: Self-pay | Admitting: *Deleted

## 2023-11-09 DIAGNOSIS — Z79899 Other long term (current) drug therapy: Secondary | ICD-10-CM

## 2023-11-09 DIAGNOSIS — I10 Essential (primary) hypertension: Secondary | ICD-10-CM

## 2024-09-25 ENCOUNTER — Ambulatory Visit: Attending: Nurse Practitioner | Admitting: Nurse Practitioner

## 2024-09-25 ENCOUNTER — Encounter: Payer: Self-pay | Admitting: Nurse Practitioner

## 2024-09-25 VITALS — BP 150/88 | HR 61 | Ht 70.0 in | Wt 200.2 lb

## 2024-09-25 DIAGNOSIS — N529 Male erectile dysfunction, unspecified: Secondary | ICD-10-CM | POA: Diagnosis not present

## 2024-09-25 DIAGNOSIS — I255 Ischemic cardiomyopathy: Secondary | ICD-10-CM

## 2024-09-25 DIAGNOSIS — I2510 Atherosclerotic heart disease of native coronary artery without angina pectoris: Secondary | ICD-10-CM

## 2024-09-25 DIAGNOSIS — E785 Hyperlipidemia, unspecified: Secondary | ICD-10-CM

## 2024-09-25 DIAGNOSIS — I10 Essential (primary) hypertension: Secondary | ICD-10-CM

## 2024-09-25 MED ORDER — NITROGLYCERIN 0.4 MG SL SUBL: 0.4000 mg | SUBLINGUAL_TABLET | SUBLINGUAL | 3 refills | Status: AC | PRN

## 2024-09-25 MED ORDER — LISINOPRIL 40 MG PO TABS: 40.0000 mg | ORAL_TABLET | Freq: Every day | ORAL | 3 refills | Status: AC

## 2024-09-25 MED ORDER — SILDENAFIL CITRATE 20 MG PO TABS: 20.0000 mg | ORAL_TABLET | ORAL | 3 refills | Status: AC | PRN

## 2024-09-25 NOTE — Patient Instructions (Signed)
 Medication Instructions:  Your physician recommends the following medication changes.  INCREASE: Lisinopril  40 mg daily   *If you need a refill on your cardiac medications before your next appointment, please call your pharmacy*  Lab Work: Your provider would like for you to return in 1-2 weeks to have the following labs drawn: BMeT.   Please go to [facility] [address] ([address]) [address], [place] [address] You do not need an appointment.  They are open from 8 am- 4:30 pm.  Lunch from 1:00 pm- 2:00 pm You DO NOT need to be fasting.   You may also go to one of the following [facility]:  [address] [place], [place] [address] Phone: [phone] Lab hours: Mon-Fri 8 am- 5 pm    Lunch 12 pm- 1 pm  [address] [place],  [place]  [address]  [place]  Phone: [phone] Lab hours: 7 am- 4 pm Lunch 12 pm-1 pm   [address] [place],  [place]  [address]  [place]  Phone: [phone] Lab hours: Mon-Fri 8 am- 5 pm    Lunch 12 pm- 1 pm  If you have labs (blood work) drawn today and your tests are completely normal, you will receive your results only by: MyChart Message (if you have MyChart) OR A paper copy in the mail If you have any lab test that is abnormal or we need to change your treatment, we will call you to review the results.  Follow-Up: At [facility], you and your health needs are our priority.  As part of our continuing mission to provide you with exceptional heart care, our providers are all part of one team.  This team includes your primary Cardiologist (physician) and Advanced Practice Providers or APPs (Physician Assistants and Nurse Practitioners) who all work together to provide you with the care you need, when you need it.  Your next appointment:   1 year(s)  Provider:   You may see Lonni Hanson, MD or one of the following Advanced Practice Providers on your designated Care Team:   Lonni Meager, NP Lesley Maffucci, PA-C Bernardino Bring, PA-C Cadence Clintondale, PA-C Tylene Lunch, NP Barnie Hila, NP   We recommend signing up for the patient portal called MyChart.  Sign up information is provided on this After Visit Summary.  MyChart is used to connect with patients for Virtual Visits (Telemedicine).  Patients are able to view lab/test results, encounter notes, upcoming appointments, etc.  Non-urgent messages can be sent to your provider as well.   To learn more about what you can do with MyChart, go to [email or web address].

## 2024-09-25 NOTE — Progress Notes (Signed)
 "    Office Visit    Patient Name: Todd Reynolds Date of Encounter: 09/25/2024  Primary Care Provider:  [facility] Primary Cardiologist:  Lonni Hanson, MD  Cardiology APP:  Vivienne Lonni Ingle, NP   Chief Complaint    69 y.o. male  w/ a h/o CAD and NSTEMI s/p DES  LCX, ischemic cardiomyopathy, and HL, who presents for follow-up related to CAD.   Past Medical History   Subjective   Past Medical History:  Diagnosis Date   CAD with hx of NSTEMI    a. 08/2018 NSTEMI/PCI: LM 20d, LAD 50p/d, D1 85ost, RI 90, LCX 177m/d - thrombotic (3.0x15 [place] DES), OM1 small/nl, OM2 mod/nl, OM3 large - fills via dLAD collats, RCA 40p/m, RPDA1 60, RPDA2 75, RPAV mod/nl. EF 45-50%.   Essential hypertension    Hyperlipidemia LDL goal <70    Ischemic cardiomyopathy    a. 08/2018 LV gram: EF 45-50%; b. 08/2018 Echo: EF 50-55%, mild inflat HK. Mild MR, mildly dil LA/RA.   Past Surgical History:  Procedure Laterality Date   CORONARY STENT INTERVENTION N/A 08/28/2018   Procedure: CORONARY STENT INTERVENTION;  Surgeon: Hanson Lonni, MD;  Location: [facility] INVASIVE CV LAB;  Service: Cardiovascular;  Laterality: N/A;   CORONARY THROMBECTOMY N/A 08/28/2018   Procedure: Coronary Thrombectomy;  Surgeon: Hanson Lonni, MD;  Location: [facility] INVASIVE CV LAB;  Service: Cardiovascular;  Laterality: N/A;   LEFT HEART CATH AND CORONARY ANGIOGRAPHY N/A 08/28/2018   Procedure: LEFT HEART CATH AND CORONARY ANGIOGRAPHY;  Surgeon: Hanson Lonni, MD;  Location: [facility] INVASIVE CV LAB;  Service: Cardiovascular;  Laterality: N/A;    Allergies  Allergies[1]     History of Present Illness      69 y.o. y/o male with the above past medical history including CAD, hyperlipidemia, and ischemic cardiomyopathy.  In December 2019, he was admitted to [facility] with chest pain and non-STEMI.  Catheterization showed moderate to severe multivessel disease 100% thrombotic occlusion in the mid to  distal left circumflex.  This was successfully intervened upon with placement of a drug-eluting stent.  EF was 45 to 50% by ventriculography and subsequently 50 to 55% by echo.  He was noted to have residual disease involving the diagonal, ramus, and distal RCA, though these areas were small (less than 2 mm), and medical therapy was recommended.     [name] was last seen in cardiology clinic in January 2025, at which time he was doing well.  He says that over the past year, he has remained active without symptoms or limitations but is not necessarily exercising regularly.  He continues to work, engineer, agricultural.  He is disappointed that work has dried up some and as result, he has been somewhat less active and has gained some weight.  He denies chest pain, dyspnea, palpitations, PND, orthopnea, dizziness, syncope, edema, or early satiety. Objective   Home Medications    Current Outpatient Medications  Medication Sig Dispense Refill   aspirin  81 MG EC tablet Take 1 tablet (81 mg total) by mouth daily. 30 tablet 2   atorvastatin  (LIPITOR) 80 MG tablet Take 1 tablet (80 mg total) by mouth daily. 90 tablet 1   clopidogrel  (PLAVIX ) 75 MG tablet Take 1 tablet by mouth once daily 90 tablet 0   ezetimibe  (ZETIA ) 10 MG tablet Take 1 tablet (10 mg total) by mouth daily. 90 tablet 1   metFORMIN  (GLUCOPHAGE ) 1000 MG tablet Take 0.5 tablets (500 mg total) by mouth 2 (two)  times daily with a meal. 60 tablet 0   metoprolol  tartrate (LOPRESSOR ) 25 MG tablet Take 1/2 (one-half) tablet by mouth twice daily 90 tablet 0   triamcinolone  ointment (KENALOG ) 0.5 % Apply 1 application topically 2 (two) times daily. 30 g 0   lisinopril  (ZESTRIL ) 40 MG tablet Take 1 tablet (40 mg total) by mouth daily. 90 tablet 3   nitroGLYCERIN  (NITROSTAT ) 0.4 MG SL tablet Place 1 tablet (0.4 mg total) under the tongue every 5 (five) minutes as needed for chest pain. 25 tablet 3   sildenafil  (REVATIO ) 20 MG tablet Take 1 tablet (20 mg  total) by mouth as needed. 5 tablet 3   No current facility-administered medications for this visit.     Physical Exam    VS:  BP (!) 150/88   Pulse 61   Ht 5' 10 (1.778 m)   Wt 200 lb 4 oz (90.8 kg)   SpO2 98%   BMI 28.73 kg/m  , BMI Body mass index is 28.73 kg/m.    Vitals:   09/25/24 1342 09/25/24 1602  BP: (!) 144/90 (!) 150/88  Pulse: 61   SpO2: 98%           GEN: Well nourished, well developed, in no acute distress. HEENT: normal. Neck: Supple, no JVD, carotid bruits, or masses. Cardiac: RRR, no murmurs, rubs, or gallops. No clubbing, cyanosis, edema.  Radials 2+/PT 2+ and equal bilaterally.  Respiratory:  Respirations regular and unlabored, clear to auscultation bilaterally. GI: Soft, nontender, nondistended, BS + x 4. MS: no deformity or atrophy. Skin: warm and dry, no rash. Neuro:  Strength and sensation are intact. Psych: Normal affect.  Accessory Clinical Findings    ECG personally reviewed by me today - EKG Interpretation Date/Time:  Thursday September 25 2024 13:47:49 EST Ventricular Rate:  61 PR Interval:  160 QRS Duration:  78 QT Interval:  420 QTC Calculation: 422 R Axis:   12  Text Interpretation: Normal sinus rhythm Normal ECG Confirmed by Vivienne Bruckner [phone]) on 09/25/2024 1:57:49 PM   - no acute changes.  Labs from [place] dated May 02, 2024:  Hemoglobin 14.9, hematocrit 47.7, WBC 2.9, platelets 212 Sodium 142, potassium 4.6, chloride 107, CO2 22, BUN 9, creatinine 1.07, glucose 104 Total cholesterol 133, triglycerides 46, HDL 38, LDL 84 Hemoglobin J8r 6.2 TSH 2.87 PSA 0.6    Assessment & Plan    1.  Coronary artery disease: Status post non-STEMI and PCI of the circumflex in December 2019.  Known small vessel residual disease without recurrent symptoms.  He remains relatively active though is not routinely exercising.  ECG without acute ST or T changes today.  Continue aspirin , statin, Zetia , Plavix , beta-blocker, and ACE  inhibitor therapy.  2.  Ischemic cardiomyopathy: EF 45 to 50% by ventriculography at the time of his non-STEMI in December 2019 with subsequent echo showing EF of 50-55% with mild inferolateral hypokinesis.  He does not experience dyspnea or edema and is euvolemic on examination.  His heart rate is well-controlled though blood pressure elevated and I am titrating his lisinopril  to 40 mg daily.  Continue low-dose metoprolol .  3.  Hyperlipidemia: Following the addition of Zetia  last year, his LDL came down to 70.  Repeat labs in August showed an LDL of 84.  He notes compliance with his atorvastatin  and Zetia .  We discussed the potential need for PCSK9 inhibitor but mutually agreed that he would be better served by adjusting his dietary intake of saturated fats and cholesterol.  I strongly encourage a more whole food plant-based diet as well as at least 30 minutes of regular exercise most days of the week.  4.  Primary hypertension: Blood pressure elevated on 2 consecutive recordings today.  Increasing lisinopril  to 40 mg daily.  Continue current dose of beta-blocker.  Follow-up basic metabolic panel in 1 to 2 weeks.  5.  Erectile dysfunction: He has not required any nitrates and does not experience chest pain and continues to use sildenafil  as needed.  Refilling today and again cautioned him on the use of nitrates if he were to develop chest pain following the use of sildenafil .  6.  Disposition: Follow-up basic metabolic panel in 1 to 2 weeks.  Patient prefers to maintain an annual follow-up with cardiology and notes that he will follow-up with his primary care provider in between.  Lonni Meager, NP 09/25/2024, 4:02 PM     [1] No Known Allergies  "
